# Patient Record
Sex: Female | Born: 1980 | Race: White | Hispanic: No | Marital: Married | Smoking: Former smoker
Health system: Southern US, Community
[De-identification: ages and names within clinical notes are randomized; demographics above are authoritative.]

## PROBLEM LIST (undated history)

## (undated) DIAGNOSIS — N200 Calculus of kidney: Secondary | ICD-10-CM

## (undated) HISTORY — PX: TUBAL LIGATION: SHX77

---

## 2002-11-28 ENCOUNTER — Emergency Department (HOSPITAL_COMMUNITY): Admission: EM | Admit: 2002-11-28 | Discharge: 2002-11-28 | Payer: Self-pay | Admitting: Emergency Medicine

## 2004-03-05 ENCOUNTER — Emergency Department (HOSPITAL_COMMUNITY): Admission: EM | Admit: 2004-03-05 | Discharge: 2004-03-05 | Payer: Self-pay | Admitting: Emergency Medicine

## 2004-06-23 ENCOUNTER — Emergency Department (HOSPITAL_COMMUNITY): Admission: EM | Admit: 2004-06-23 | Discharge: 2004-06-23 | Payer: Self-pay | Admitting: *Deleted

## 2004-10-23 ENCOUNTER — Emergency Department (HOSPITAL_COMMUNITY): Admission: EM | Admit: 2004-10-23 | Discharge: 2004-10-23 | Payer: Self-pay | Admitting: Emergency Medicine

## 2004-11-29 ENCOUNTER — Other Ambulatory Visit: Payer: Self-pay

## 2004-11-29 ENCOUNTER — Emergency Department: Payer: Self-pay | Admitting: Emergency Medicine

## 2005-12-12 ENCOUNTER — Emergency Department (HOSPITAL_COMMUNITY): Admission: EM | Admit: 2005-12-12 | Discharge: 2005-12-12 | Payer: Self-pay | Admitting: Emergency Medicine

## 2007-02-25 ENCOUNTER — Emergency Department (HOSPITAL_COMMUNITY): Admission: EM | Admit: 2007-02-25 | Discharge: 2007-02-25 | Payer: Self-pay | Admitting: Emergency Medicine

## 2007-09-22 ENCOUNTER — Emergency Department (HOSPITAL_COMMUNITY): Admission: EM | Admit: 2007-09-22 | Discharge: 2007-09-22 | Payer: Self-pay | Admitting: Emergency Medicine

## 2008-07-25 ENCOUNTER — Emergency Department (HOSPITAL_COMMUNITY): Admission: EM | Admit: 2008-07-25 | Discharge: 2008-07-25 | Payer: Self-pay | Admitting: Emergency Medicine

## 2009-01-17 ENCOUNTER — Emergency Department (HOSPITAL_COMMUNITY): Admission: EM | Admit: 2009-01-17 | Discharge: 2009-01-17 | Payer: Self-pay | Admitting: Emergency Medicine

## 2011-04-19 LAB — RAPID STREP SCREEN (MED CTR MEBANE ONLY): Streptococcus, Group A Screen (Direct): POSITIVE — AB

## 2011-05-02 LAB — URINE MICROSCOPIC-ADD ON

## 2011-05-02 LAB — PREGNANCY, URINE: Preg Test, Ur: NEGATIVE

## 2011-05-02 LAB — URINALYSIS, ROUTINE W REFLEX MICROSCOPIC
Bilirubin Urine: NEGATIVE
Glucose, UA: NEGATIVE mg/dL
Ketones, ur: NEGATIVE mg/dL
Leukocytes, UA: NEGATIVE
Nitrite: NEGATIVE
Specific Gravity, Urine: 1.025 (ref 1.005–1.030)
Urobilinogen, UA: 0.2 mg/dL (ref 0.0–1.0)
pH: 6 (ref 5.0–8.0)

## 2011-05-02 LAB — CBC
HCT: 42.5 % (ref 36.0–46.0)
Hemoglobin: 14.2 g/dL (ref 12.0–15.0)
MCHC: 33.5 g/dL (ref 30.0–36.0)
MCV: 90.5 fL (ref 78.0–100.0)
Platelets: 213 10*3/uL (ref 150–400)
RBC: 4.69 MIL/uL (ref 3.87–5.11)
RDW: 12.4 % (ref 11.5–15.5)
WBC: 7.3 10*3/uL (ref 4.0–10.5)

## 2011-05-02 LAB — DIFFERENTIAL
Basophils Absolute: 0 10*3/uL (ref 0.0–0.1)
Basophils Relative: 0 % (ref 0–1)
Eosinophils Absolute: 0.2 10*3/uL (ref 0.0–0.7)
Eosinophils Relative: 2 % (ref 0–5)
Lymphocytes Relative: 22 % (ref 12–46)
Lymphs Abs: 1.6 10*3/uL (ref 0.7–4.0)
Monocytes Absolute: 0.4 10*3/uL (ref 0.1–1.0)
Monocytes Relative: 6 % (ref 3–12)
Neutro Abs: 5.1 10*3/uL (ref 1.7–7.7)
Neutrophils Relative %: 69 % (ref 43–77)

## 2011-05-13 LAB — URINALYSIS, ROUTINE W REFLEX MICROSCOPIC
Bilirubin Urine: NEGATIVE
Glucose, UA: NEGATIVE
Hgb urine dipstick: NEGATIVE
Ketones, ur: NEGATIVE
Nitrite: NEGATIVE
Protein, ur: NEGATIVE
Specific Gravity, Urine: 1.025
Urobilinogen, UA: 0.2
pH: 6.5

## 2011-05-13 LAB — BASIC METABOLIC PANEL
BUN: 11
CO2: 27
Calcium: 9
Chloride: 105
Creatinine, Ser: 0.74
GFR calc Af Amer: >60
GFR calc non Af Amer: >60
Glucose, Bld: 95
Potassium: 4.2
Sodium: 138

## 2011-05-13 LAB — GC/CHLAMYDIA PROBE AMP, GENITAL
Chlamydia, DNA Probe: NEGATIVE
GC Probe Amp, Genital: NEGATIVE

## 2011-05-13 LAB — DIFFERENTIAL
Basophils Absolute: 0
Basophils Relative: 1
Eosinophils Absolute: 0.2
Eosinophils Relative: 3
Lymphocytes Relative: 27
Lymphs Abs: 1.9
Monocytes Absolute: 0.5
Monocytes Relative: 7
Neutro Abs: 4.5
Neutrophils Relative %: 62

## 2011-05-13 LAB — WET PREP, GENITAL
Trich, Wet Prep: NONE SEEN
Yeast Wet Prep HPF POC: NONE SEEN

## 2011-05-13 LAB — CBC
HCT: 38.1
Hemoglobin: 12.8
MCHC: 33.6
MCV: 88.7
Platelets: 181
RBC: 4.3
RDW: 13.2
WBC: 7.2

## 2011-05-13 LAB — PREGNANCY, URINE: Preg Test, Ur: NEGATIVE

## 2011-09-05 ENCOUNTER — Emergency Department (HOSPITAL_COMMUNITY)
Admission: EM | Admit: 2011-09-05 | Discharge: 2011-09-05 | Disposition: A | Discharge status: Home / Self Care | Payer: Self-pay | Attending: Emergency Medicine | Admitting: Emergency Medicine

## 2011-09-05 ENCOUNTER — Encounter (HOSPITAL_COMMUNITY): Payer: Self-pay

## 2011-09-05 DIAGNOSIS — R319 Hematuria, unspecified: Secondary | ICD-10-CM | POA: Insufficient documentation

## 2011-09-05 DIAGNOSIS — R3911 Hesitancy of micturition: Secondary | ICD-10-CM | POA: Insufficient documentation

## 2011-09-05 DIAGNOSIS — F17200 Nicotine dependence, unspecified, uncomplicated: Secondary | ICD-10-CM | POA: Insufficient documentation

## 2011-09-05 DIAGNOSIS — R109 Unspecified abdominal pain: Secondary | ICD-10-CM | POA: Insufficient documentation

## 2011-09-05 DIAGNOSIS — R112 Nausea with vomiting, unspecified: Secondary | ICD-10-CM | POA: Insufficient documentation

## 2011-09-05 LAB — URINALYSIS, ROUTINE W REFLEX MICROSCOPIC
Glucose, UA: NEGATIVE mg/dL
Ketones, ur: NEGATIVE mg/dL
Leukocytes, UA: NEGATIVE
Specific Gravity, Urine: >1.03 — ABNORMAL HIGH (ref 1.005–1.030)

## 2011-09-05 LAB — URINE MICROSCOPIC-ADD ON

## 2011-09-05 LAB — POCT PREGNANCY, URINE: Preg Test, Ur: NEGATIVE

## 2011-09-05 MED ORDER — ONDANSETRON 8 MG PO TBDP
8.0000 mg | ORAL_TABLET | Freq: Once | ORAL | Status: AC
  Administered 2011-09-05: 8 mg via ORAL
  Filled 2011-09-05: qty 1

## 2011-09-05 MED ORDER — IBUPROFEN 800 MG PO TABS
800.0000 mg | ORAL_TABLET | Freq: Once | ORAL | Status: AC
  Administered 2011-09-05: 800 mg via ORAL
  Filled 2011-09-05: qty 1

## 2011-09-05 MED ORDER — OXYCODONE-ACETAMINOPHEN 5-325 MG PO TABS
ORAL_TABLET | ORAL | Status: AC
  Filled 2011-09-05: qty 1

## 2011-09-05 MED ORDER — OXYCODONE-ACETAMINOPHEN 5-325 MG PO TABS: 1.0000 | ORAL_TABLET | ORAL | Status: AC | PRN

## 2011-09-05 MED ORDER — OXYCODONE-ACETAMINOPHEN 5-325 MG PO TABS
2.0000 | ORAL_TABLET | Freq: Once | ORAL | Status: AC
  Administered 2011-09-05: 2 via ORAL
  Filled 2011-09-05: qty 2

## 2011-09-05 NOTE — ED Notes (Signed)
Complain of pain in left flank area 

## 2011-09-05 NOTE — ED Notes (Signed)
C/o sudden onset of left flank pain, suprapubic pain and urinary frequency at 0500 this morning.  C/o n/v x 1 episode, denies nausea presently.

## 2011-09-05 NOTE — ED Notes (Signed)
Denies pain and nausea; left in c/o husband for transport home; instructions reviewed and f/u information provided. Verbalizes understanding.

## 2011-09-05 NOTE — ED Provider Notes (Signed)
History   This chart was scribed for Joya Gaskins, MD by Clarita Crane. The patient was seen in room APA06/APA06 and the patient's care was started at 8:13AM.   CSN: [id number]  Arrival date & time 09/05/11  4098   First MD Initiated Contact with Patient 09/05/11 0802      Chief Complaint  Patient presents with  . Flank Pain     HPI Jillian Oneill is a 31 y.o. female who presents to the Emergency Department complaining of waxing and waning moderate to severe left sided flank pain radiating to left side abdomen onset 3 hours ago and persistent since with associated urinary hesitancy, nausea and 1 episode of vomiting. Denies fever, vaginal bleeding, vaginal discharge, cough, SOB.  PMH - none  History reviewed. No pertinent past surgical history.  History reviewed. No pertinent family history.  History  Substance Use Topics  . Smoking status: Current Everyday Smoker  . Smokeless tobacco: Not on file  . Alcohol Use: No    OB History    Grav Para Term Preterm Abortions TAB SAB Ect Mult Living                  Review of Systems 10 Systems reviewed and are negative for acute change except as noted in the HPI.  Allergies  Review of patient's allergies indicates no known allergies.  Home Medications  No current outpatient prescriptions on file.  Pulse 58  Temp 97.8 F (36.6 C)  Resp 18  Ht 5\' 2"  (1.575 m)  Wt 180 lb (81.647 kg)  BMI 32.92 kg/m2  SpO2 100%  Physical Exam CONSTITUTIONAL: Well developed/well nourished HEAD AND FACE: Normocephalic/atraumatic EYES: EOMI/PERRL ENMT: Mucous membranes moist NECK: supple no meningeal signs SPINE:entire spine nontender CV: S1/S2 noted, no murmurs/rubs/gallops noted LUNGS: Lungs are clear to auscultation bilaterally, no apparent distress ABDOMEN: soft, nontender, no rebound or guarding, pain is non-reproducible JX:BJYN cva tenderness present NEURO: Pt is awake/alert, moves all extremitiesx4 EXTREMITIES: pulses  normal, full ROM SKIN: warm, color normal PSYCH: no abnormalities of mood noted  ED Course  Procedures   DIAGNOSTIC STUDIES: Oxygen Saturation is 100% on room air, normal by my interpretation.    COORDINATION OF CARE: 8:15AM- Patient informed of current plan for treatment and evaluation and agrees with plan at this time. Zofran-8mg  PO, 2xPercocet-5/325mg  PO and Ibuprofen-800mg  PO ordered.  9:17AM- Patient notes she is feeling better at this time after administration of medications.  9:55AM- Patient reports no pain at this time and advised of plan to d/c home with prescriptions for treatment. Patient agrees with plan set forth at this time.  Suspect ureteral colic, and since pain ceased, stable for d/c  Labs Reviewed  URINALYSIS, ROUTINE W REFLEX MICROSCOPIC - Abnormal; Notable for the following:    APPearance HAZY (*)    Specific Gravity, Urine >1.030 (*)    Hgb urine dipstick LARGE (*)    Bilirubin Urine SMALL (*)    Protein, ur 100 (*)    All other components within normal limits  URINE MICROSCOPIC-ADD ON - Abnormal; Notable for the following:    Squamous Epithelial / LPF MANY (*)    Bacteria, UA MANY (*)    All other components within normal limits  POCT PREGNANCY, URINE      MDM  Nursing notes reviewed and considered in documentation All labs/vitals reviewed and considered       I personally performed the services described in this documentation, which was scribed in my presence.  The recorded information has been reviewed and considered.      Joya Gaskins, MD 09/05/11 1459

## 2011-12-17 ENCOUNTER — Emergency Department (HOSPITAL_COMMUNITY)
Admission: EM | Admit: 2011-12-17 | Discharge: 2011-12-18 | Disposition: A | Discharge status: Home / Self Care | Payer: Self-pay | Attending: Emergency Medicine | Admitting: Emergency Medicine

## 2011-12-17 ENCOUNTER — Emergency Department (HOSPITAL_COMMUNITY): Payer: Self-pay

## 2011-12-17 ENCOUNTER — Encounter (HOSPITAL_COMMUNITY): Payer: Self-pay | Admitting: *Deleted

## 2011-12-17 DIAGNOSIS — R0989 Other specified symptoms and signs involving the circulatory and respiratory systems: Secondary | ICD-10-CM | POA: Insufficient documentation

## 2011-12-17 DIAGNOSIS — R0600 Dyspnea, unspecified: Secondary | ICD-10-CM

## 2011-12-17 DIAGNOSIS — R109 Unspecified abdominal pain: Secondary | ICD-10-CM | POA: Insufficient documentation

## 2011-12-17 DIAGNOSIS — R0609 Other forms of dyspnea: Secondary | ICD-10-CM | POA: Insufficient documentation

## 2011-12-17 HISTORY — DX: Calculus of kidney: N20.0

## 2011-12-17 NOTE — ED Notes (Signed)
Pt reports be has been experiencing occasional  SOB for about 1 week.  Reports at work she has to bend and lift a lot, and gets SOB when she does.  Also reporting some pain in right lower abdomen/groin area that started today.

## 2011-12-17 NOTE — ED Provider Notes (Signed)
History     CSN: [id number]  Arrival date & time 12/17/11  2020   First MD Initiated Contact with Patient 12/17/11 2304      Chief Complaint  Patient presents with  . Shortness of Breath  . Abdominal Pain    (Consider location/radiation/quality/duration/timing/severity/associated sxs/prior treatment) Patient is a 31 y.o. female presenting with shortness of breath and abdominal pain. The history is provided by the patient.  Shortness of Breath  The current episode started 5 to 7 days ago. The problem occurs occasionally. The problem has been gradually worsening. The problem is moderate. The symptoms are relieved by rest and cold air. Exacerbated by: lifting, pushing and pulling. Associated symptoms include shortness of breath. Pertinent negatives include no chest pain, no chest pressure, no cough and no wheezing. There was no intake of a foreign body. She has not inhaled smoke recently. She has had no prior hospitalizations. She has had no prior ICU admissions. She has had no prior intubations. Her past medical history does not include asthma or bronchiolitis. She has been behaving normally. Urine output has been normal. The last void occurred less than 6 hours ago. There were no sick contacts. She has received no recent medical care.  Abdominal Pain The primary symptoms of the illness include abdominal pain and shortness of breath. The primary symptoms of the illness do not include dysuria.  The patient's medical history does not include asthma.  Symptoms associated with the illness do not include hematuria, frequency or back pain.    Past Medical History  Diagnosis Date  . Kidney stones     Past Surgical History  Procedure Date  . Cesarean section     History reviewed. No pertinent family history.  History  Substance Use Topics  . Smoking status: Current Everyday Smoker -- 0.2 packs/day    Types: Cigarettes  . Smokeless tobacco: Not on file  . Alcohol Use: No    OB  History    Grav Para Term Preterm Abortions TAB SAB Ect Mult Living                  Review of Systems  Constitutional: Negative for activity change.       All ROS Neg except as noted in HPI  HENT: Negative for nosebleeds and neck pain.   Eyes: Negative for photophobia and discharge.  Respiratory: Positive for shortness of breath. Negative for cough and wheezing.   Cardiovascular: Negative for chest pain and palpitations.  Gastrointestinal: Positive for abdominal pain. Negative for blood in stool.  Genitourinary: Negative for dysuria, frequency and hematuria.  Musculoskeletal: Negative for back pain and arthralgias.  Skin: Negative.   Neurological: Negative for dizziness, seizures and speech difficulty.  Psychiatric/Behavioral: Negative for hallucinations and confusion.    Allergies  Review of patient's allergies indicates no known allergies.  Home Medications   Current Outpatient Rx  Name Route Sig Dispense Refill  . IBUPROFEN 200 MG PO TABS Oral Take 800 mg by mouth daily as needed. Pain      BP 113/76  Pulse 97  Temp(Src) 97.9 F (36.6 C) (Oral)  Resp 20  Ht 5\' 2"  (1.575 m)  Wt 185 lb (83.915 kg)  BMI 33.84 kg/m2  SpO2 100%  Physical Exam  Nursing note and vitals reviewed. Constitutional: She is oriented to person, place, and time. She appears well-developed and well-nourished.  Non-toxic appearance.  HENT:  Head: Normocephalic.  Right Ear: Tympanic membrane and external ear normal.  Left Ear: Tympanic membrane  and external ear normal.  Eyes: EOM and lids are normal. Pupils are equal, round, and reactive to light.  Neck: Normal range of motion. Neck supple. Carotid bruit is not present.  Cardiovascular: Normal rate, regular rhythm, normal heart sounds, intact distal pulses and normal pulses.   Pulmonary/Chest: Breath sounds normal. No respiratory distress. She has no wheezes. She has no rales.       Few rhonchi that mostly clear with cough  Abdominal: Soft.  Bowel sounds are normal. There is no tenderness. There is no guarding.  Musculoskeletal: Normal range of motion.       Lumbar area. Pain with change of position and leg raises.  Lymphadenopathy:       Head (right side): No submandibular adenopathy present.       Head (left side): No submandibular adenopathy present.    She has no cervical adenopathy.  Neurological: She is alert and oriented to person, place, and time. She has normal strength. No cranial nerve deficit or sensory deficit. She exhibits normal muscle tone. Coordination normal.  Skin: Skin is warm and dry.  Psychiatric: She has a normal mood and affect. Her speech is normal.    ED Course  Procedures (including critical care time) EKG 2013 - rate: 56. Rhythm: Sinus bradycardia with sinus arrhythmia. P.-R.: Normal. QRS: Normal. ST: Normal. No life-threatening arrhythmia  Appreciated. No STEMI. Labs Reviewed - No data to display No results found. Pulse ox 100% on room air. WNL by my interpretation.  No diagnosis found.    MDM  I have reviewed nursing notes, vital signs, and all appropriate lab and imaging results for this patient. Labs, and chest xray are negative. EKG non-acute. NO reported chest pain or chest tightness or LOC. Pt to follow up with the [facility]. Albuterol inhaler given for pt to try if difficulty breathing should return. Pt invited to return to the Emergency Dept if any changes or concerns. Pt ambulatory in ED without problem.       Kathie Dike, [place] 12/18/11 [phone]

## 2011-12-17 NOTE — ED Notes (Signed)
States her job that she lifts and bends a lot at work, now has SOB

## 2011-12-18 LAB — CBC
HCT: 43.2 % (ref 36.0–46.0)
Platelets: 209 10*3/uL (ref 150–400)
RDW: 12.5 % (ref 11.5–15.5)
WBC: 8.6 10*3/uL (ref 4.0–10.5)

## 2011-12-18 LAB — BASIC METABOLIC PANEL
Calcium: 10.4 mg/dL (ref 8.4–10.5)
Chloride: 103 mEq/L (ref 96–112)
Creatinine, Ser: 0.93 mg/dL (ref 0.50–1.10)
GFR calc Af Amer: >90 mL/min (ref >90)
GFR calc non Af Amer: 81 mL/min — ABNORMAL LOW (ref >90)

## 2011-12-18 LAB — DIFFERENTIAL
Basophils Absolute: 0.1 10*3/uL (ref 0.0–0.1)
Basophils Relative: 1 % (ref 0–1)
Lymphocytes Relative: 39 % (ref 12–46)
Neutro Abs: 4.1 10*3/uL (ref 1.7–7.7)
Neutrophils Relative %: 47 % (ref 43–77)

## 2011-12-18 LAB — TROPONIN I: Troponin I: <0.3 ng/mL (ref <0.30)

## 2011-12-18 MED ORDER — ALBUTEROL SULFATE HFA 108 (90 BASE) MCG/ACT IN AERS
2.0000 | INHALATION_SPRAY | RESPIRATORY_TRACT | Status: DC | PRN
  Filled 2011-12-18: qty 6.7

## 2011-12-18 NOTE — Discharge Instructions (Signed)
Your lab test and xrays are negative for acute problem tonight. Please use albuterol inhaler every 4 hours for difficulty with breathing. Please see your primary MD or MD at the [facility] for additional evaluation. Return to the Emergency Dept if any acute changes.Dyspnea Shortness of breath (dyspnea) is the feeling of uneasy breathing. Dyspnea should be evaluated promptly. DIAGNOSIS  Many tests may be done to find why you are having shortness of breath. Tests may include:  A chest X-ray.   A lung function test.   Blood tests.   Recordings of the electrical activity of the heart (electrocardiogram).   Exercise testing.   Sound wave images of the heart (a cardiac echocardiogram).   A scan.  A cause for your shortness of breath may not be identified initially. In this case, it is important to have a follow-up exam with your caregiver. HOME CARE INSTRUCTIONS   Do not smoke. Smoking is a common cause of shortness of breath. Ask for help to stop smoking.   Avoid being around chemicals that may bother your breathing, such as paint fumes or dust.   Rest as needed. Slowly begin your usual activities.   If medications were prescribed, take them as directed for the full length of time directed. This includes oxygen and any inhaled medications, if prescribed.   It is very important that you follow up with your caregiver or other physician as directed. Waiting to do so or failure to follow up could result in worsening of your condition, possible disability, or death.   Be sure you understand what to do or who to call if your shortness of breath worsens.  SEEK MEDICAL CARE IF:   Your condition does not improve in the time expected.   You have a hard time doing your normal activities even with rest.   You have any side effects from or problems with medications prescribed.  SEEK IMMEDIATE MEDICAL CARE IF:   You feel your shortness of breath is getting worse.   You feel  lightheaded, faint or develop a cough not controlled with medications.   You start coughing up blood.   You get pain with breathing.   You get chest pain or pain in your arms, shoulders or belly (abdomen).   You have a fever.   You are unable to walk up stairs or exercise the way you normally can.  MAKE SURE YOU:   Understand these instructions.   Will watch your condition.   Will get help right away if you are not doing well or get worse.  Document Released: 08/22/2004 Document Revised: 03/27/2011 Document Reviewed: 11/30/2009 [facility] Patient Information 2012 [place], [place].

## 2011-12-18 NOTE — ED Provider Notes (Signed)
Medical screening examination/treatment/procedure(s) were performed by non-physician practitioner and as supervising physician I was immediately available for consultation/collaboration.  Alisabeth Selkirk S. Nandana Krolikowski, MD 12/18/11 0447 

## 2014-10-31 ENCOUNTER — Emergency Department (HOSPITAL_COMMUNITY)
Admission: EM | Admit: 2014-10-31 | Discharge: 2014-10-31 | Disposition: A | Discharge status: Home / Self Care | Payer: Worker's Compensation | Attending: Emergency Medicine | Admitting: Emergency Medicine

## 2014-10-31 ENCOUNTER — Encounter (HOSPITAL_COMMUNITY): Payer: Self-pay | Admitting: Emergency Medicine

## 2014-10-31 ENCOUNTER — Emergency Department (HOSPITAL_COMMUNITY): Payer: Worker's Compensation

## 2014-10-31 DIAGNOSIS — W010XXA Fall on same level from slipping, tripping and stumbling without subsequent striking against object, initial encounter: Secondary | ICD-10-CM | POA: Insufficient documentation

## 2014-10-31 DIAGNOSIS — S40012A Contusion of left shoulder, initial encounter: Secondary | ICD-10-CM | POA: Insufficient documentation

## 2014-10-31 DIAGNOSIS — Z720 Tobacco use: Secondary | ICD-10-CM | POA: Insufficient documentation

## 2014-10-31 DIAGNOSIS — Z87442 Personal history of urinary calculi: Secondary | ICD-10-CM | POA: Insufficient documentation

## 2014-10-31 DIAGNOSIS — S60212A Contusion of left wrist, initial encounter: Secondary | ICD-10-CM | POA: Insufficient documentation

## 2014-10-31 DIAGNOSIS — T1490XA Injury, unspecified, initial encounter: Secondary | ICD-10-CM

## 2014-10-31 DIAGNOSIS — Y9289 Other specified places as the place of occurrence of the external cause: Secondary | ICD-10-CM | POA: Insufficient documentation

## 2014-10-31 DIAGNOSIS — Y9389 Activity, other specified: Secondary | ICD-10-CM | POA: Insufficient documentation

## 2014-10-31 DIAGNOSIS — Y990 Civilian activity done for income or pay: Secondary | ICD-10-CM | POA: Insufficient documentation

## 2014-10-31 NOTE — ED Notes (Signed)
PT c/o slipping on a wet surface at work and falling onto her left side and c/o left arm and left hip pain. PT states this is workman's comp and registration made aware.

## 2014-10-31 NOTE — ED Provider Notes (Signed)
CSN: [id number]     Arrival date & time 10/31/14  0734 History   First MD Initiated Contact with Patient 10/31/14 [phone]     Chief Complaint  Patient presents with  . Fall     (Consider location/radiation/quality/duration/timing/severity/associated sxs/prior Treatment) HPI Comments: Patient here after slipping and falling at work and injuring her left wrist and left shoulder. Denies any head injury. No neck or back pain. Denies any hip pain. Pain at the left wrist and shoulder characterized as dull and worse with movement and better with rest. No treatment use prior to arrival.  Patient is a 34 y.o. female presenting with fall. The history is provided by the patient.  Fall    Past Medical History  Diagnosis Date  . Kidney stones    Past Surgical History  Procedure Laterality Date  . Cesarean section    . Tubal ligation     Family History  Problem Relation Age of Onset  . Hypertension Mother   . Hypertension Father    History  Substance Use Topics  . Smoking status: Current Every Day Smoker -- 0.25 packs/day    Types: Cigarettes  . Smokeless tobacco: Not on file  . Alcohol Use: No   OB History    Gravida Para Term Preterm AB TAB SAB Ectopic Multiple Living            2     Review of Systems  All other systems reviewed and are negative.     Allergies  Review of patient's allergies indicates no known allergies.  Home Medications   Prior to Admission medications   Medication Sig Start Date End Date Taking? Authorizing Provider  ibuprofen (ADVIL,MOTRIN) 200 MG tablet Take 800 mg by mouth daily as needed. Pain    Historical Provider, MD   BP 110/77 mmHg  Pulse 74  Temp(Src) 97.9 F (36.6 C) (Oral)  Resp 18  Ht  (1.575 m)  Wt 180 lb (81.647 kg)  BMI 32.91 kg/m2  SpO2 100% Physical Exam  Constitutional: She is oriented to person, place, and time. She appears well-developed and well-nourished.  Non-toxic appearance. No distress.  HENT:  Head:  Normocephalic and atraumatic.  Eyes: Conjunctivae, EOM and lids are normal. Pupils are equal, round, and reactive to light.  Neck: Normal range of motion. Neck supple. No tracheal deviation present. No thyroid mass present.  Cardiovascular: Normal rate, regular rhythm and normal heart sounds.  Exam reveals no gallop.   No murmur heard. Pulmonary/Chest: Effort normal and breath sounds normal. No stridor. No respiratory distress. She has no decreased breath sounds. She has no wheezes. She has no rhonchi. She has no rales.  Abdominal: Soft. Normal appearance and bowel sounds are normal. She exhibits no distension. There is no tenderness. There is no rebound and no CVA tenderness.  Musculoskeletal: Normal range of motion. She exhibits no edema or tenderness.       Arms: Neurological: She is alert and oriented to person, place, and time. She has normal strength. No cranial nerve deficit or sensory deficit. GCS eye subscore is 4. GCS verbal subscore is 5. GCS motor subscore is 6.  Skin: Skin is warm and dry. No abrasion and no rash noted.  Psychiatric: She has a normal mood and affect. Her speech is normal and behavior is normal.  Nursing note and vitals reviewed.   ED Course  Procedures (including critical care time) Labs Review Labs Reviewed - No data to display  Imaging Review No results found.  EKG Interpretation None      MDM   Final diagnoses:  Trauma    All xrays neg, pt stable for d/c    Lorre NickAnthony Duston Smolenski, MD 11/04/14 [phone]

## 2014-10-31 NOTE — Discharge Instructions (Signed)
Contusion °A contusion is a deep bruise. Contusions are the result of an injury that caused bleeding under the skin. The contusion may turn blue, purple, or yellow. Minor injuries will give you a painless contusion, but more severe contusions may stay painful and swollen for a few weeks.  °CAUSES  °A contusion is usually caused by a blow, trauma, or direct force to an area of the body. °SYMPTOMS  °· Swelling and redness of the injured area. °· Bruising of the injured area. °· Tenderness and soreness of the injured area. °· Pain. °DIAGNOSIS  °The diagnosis can be made by taking a history and physical exam. An X-ray, CT scan, or MRI may be needed to determine if there were any associated injuries, such as fractures. °TREATMENT  °Specific treatment will depend on what area of the body was injured. In general, the best treatment for a contusion is resting, icing, elevating, and applying cold compresses to the injured area. Over-the-counter medicines may also be recommended for pain control. Ask your caregiver what the best treatment is for your contusion. °HOME CARE INSTRUCTIONS  °· Put ice on the injured area. °¨ Put ice in a plastic bag. °¨ Place a towel between your skin and the bag. °¨ Leave the ice on for 15-20 minutes, 3-4 times a day, or as directed by your health care provider. °· Only take over-the-counter or prescription medicines for pain, discomfort, or fever as directed by your caregiver. Your caregiver may recommend avoiding anti-inflammatory medicines (aspirin, ibuprofen, and naproxen) for 48 hours because these medicines may increase bruising. °· Rest the injured area. °· If possible, elevate the injured area to reduce swelling. °SEEK IMMEDIATE MEDICAL CARE IF:  °· You have increased bruising or swelling. °· You have pain that is getting worse. °· Your swelling or pain is not relieved with medicines. °MAKE SURE YOU:  °· Understand these instructions. °· Will watch your condition. °· Will get help right  away if you are not doing well or get worse. °Document Released: 04/24/2005 Document Revised: 07/20/2013 Document Reviewed: 05/20/2011 °[employer]® Patient Information ©2015 [employer]. This information is not intended to replace advice given to you by your health care provider. Make sure you discuss any questions you have with your health care provider. ° °

## 2015-07-01 ENCOUNTER — Emergency Department (HOSPITAL_COMMUNITY)
Admission: EM | Admit: 2015-07-01 | Discharge: 2015-07-01 | Disposition: A | Discharge status: Home / Self Care | Payer: Self-pay | Attending: Emergency Medicine | Admitting: Emergency Medicine

## 2015-07-01 ENCOUNTER — Encounter (HOSPITAL_COMMUNITY): Payer: Self-pay | Admitting: Emergency Medicine

## 2015-07-01 DIAGNOSIS — Z87442 Personal history of urinary calculi: Secondary | ICD-10-CM | POA: Insufficient documentation

## 2015-07-01 DIAGNOSIS — Z3202 Encounter for pregnancy test, result negative: Secondary | ICD-10-CM | POA: Insufficient documentation

## 2015-07-01 DIAGNOSIS — R300 Dysuria: Secondary | ICD-10-CM | POA: Insufficient documentation

## 2015-07-01 DIAGNOSIS — G8929 Other chronic pain: Secondary | ICD-10-CM | POA: Insufficient documentation

## 2015-07-01 DIAGNOSIS — R319 Hematuria, unspecified: Secondary | ICD-10-CM | POA: Insufficient documentation

## 2015-07-01 DIAGNOSIS — R1030 Lower abdominal pain, unspecified: Secondary | ICD-10-CM | POA: Insufficient documentation

## 2015-07-01 DIAGNOSIS — R0981 Nasal congestion: Secondary | ICD-10-CM | POA: Insufficient documentation

## 2015-07-01 DIAGNOSIS — F17210 Nicotine dependence, cigarettes, uncomplicated: Secondary | ICD-10-CM | POA: Insufficient documentation

## 2015-07-01 LAB — URINALYSIS, ROUTINE W REFLEX MICROSCOPIC
BILIRUBIN URINE: NEGATIVE
GLUCOSE, UA: NEGATIVE mg/dL
KETONES UR: NEGATIVE mg/dL
Leukocytes, UA: NEGATIVE
Nitrite: NEGATIVE
Specific Gravity, Urine: 1.02 (ref 1.005–1.030)
pH: 6 (ref 5.0–8.0)

## 2015-07-01 LAB — URINE MICROSCOPIC-ADD ON

## 2015-07-01 LAB — PREGNANCY, URINE: PREG TEST UR: NEGATIVE

## 2015-07-01 MED ORDER — ACETAMINOPHEN 500 MG PO TABS
1000.0000 mg | ORAL_TABLET | Freq: Once | ORAL | Status: AC
  Administered 2015-07-01: 1000 mg via ORAL
  Filled 2015-07-01: qty 2

## 2015-07-01 MED ORDER — CEPHALEXIN 500 MG PO CAPS: 500.0000 mg | ORAL_CAPSULE | Freq: Two times a day (BID) | ORAL | Status: DC

## 2015-07-01 NOTE — ED Notes (Signed)
Pt states that she woke up this morning with lower abdominal pain, dysuria, and blood when wiping.  Also wants her back checked while she is here due to chronic issues and is also c/o sinus pressure, cough, and congestion.

## 2015-07-01 NOTE — ED Provider Notes (Signed)
CSN: [id number]     Arrival date & time 07/01/15  1121 History   First MD Initiated Contact with Patient 07/01/15 1147     Chief Complaint  Patient presents with  . Abdominal Pain     (Consider location/radiation/quality/duration/timing/severity/associated sxs/prior Treatment) HPI Comments: 34 year old female presents with multiple complaints. New to this visit is dysuria, mild hematuria with wiping and suprapubic discomfort since this morning. Patient has no new sexual contacts no vaginal complaints. Patient had C-section and tubal ligation years ago. Patient is a current smoker. No vomiting or fevers. No flank pain, chronic back pain. Mild congestion and cough.  Patient is a 34 y.o. female presenting with abdominal pain. The history is provided by the patient.  Abdominal Pain Associated symptoms: dysuria   Associated symptoms: no chills, no fever, no nausea and no vomiting     Past Medical History  Diagnosis Date  . Kidney stones    Past Surgical History  Procedure Laterality Date  . Cesarean section    . Tubal ligation     Family History  Problem Relation Age of Onset  . Hypertension Mother   . Hypertension Father    Social History  Substance Use Topics  . Smoking status: Current Every Day Smoker -- 0.25 packs/day    Types: Cigarettes  . Smokeless tobacco: None  . Alcohol Use: No   OB History    Gravida Para Term Preterm AB TAB SAB Ectopic Multiple Living            2     Review of Systems  Constitutional: Negative for fever and chills.  HENT: Positive for congestion.   Gastrointestinal: Positive for abdominal pain. Negative for nausea and vomiting.  Genitourinary: Positive for dysuria.      Allergies  Review of patient's allergies indicates no known allergies.  Home Medications   Prior to Admission medications   Medication Sig Start Date End Date Taking? Authorizing Provider  Pseudoephedrine-APAP-DM (DAYQUIL PO) Take 30 mLs by mouth every 6 (six) hours  as needed (cough).   Yes Historical Provider, MD  cephALEXin (KEFLEX) 500 MG capsule Take 1 capsule (500 mg total) by mouth 2 (two) times daily. 07/01/15   Blane Ohara, MD   BP 109/69 mmHg  Pulse 61  Temp(Src) 98.8 F (37.1 C) (Oral)  Resp 18  Ht  (1.575 m)  Wt 180 lb (81.647 kg)  BMI 32.91 kg/m2  SpO2 100% Physical Exam  Constitutional: She is oriented to person, place, and time. She appears well-developed and well-nourished.  HENT:  Head: Normocephalic and atraumatic.  Eyes: Right eye exhibits no discharge. Left eye exhibits no discharge.  Neck: Neck supple. No tracheal deviation present.  Cardiovascular: Normal rate.   Pulmonary/Chest: Effort normal.  Abdominal: Soft. She exhibits no distension. There is tenderness (mild suprapubic). There is no guarding.  Genitourinary:  Mild white discharge, no cervical motion tenderness, no bleeding  Musculoskeletal: She exhibits no edema.  Neurological: She is alert and oriented to person, place, and time.  Skin: Skin is warm. No rash noted.  Psychiatric: She has a normal mood and affect.  Nursing note and vitals reviewed.   ED Course  Procedures (including critical care time) Labs Review Labs Reviewed  URINALYSIS, ROUTINE W REFLEX MICROSCOPIC (NOT AT [facility]) - Abnormal; Notable for the following:    APPearance HAZY (*)    Hgb urine dipstick MODERATE (*)    Protein, ur TRACE (*)    All other components within normal limits  URINE  MICROSCOPIC-ADD ON - Abnormal; Notable for the following:    Squamous Epithelial / LPF 6-30 (*)    Bacteria, UA MANY (*)    All other components within normal limits  URINE CULTURE  PREGNANCY, URINE  GC/CHLAMYDIA PROBE AMP ([place]) NOT AT [facility]    Imaging Review No results found. I have personally reviewed and evaluated these images and lab results as part of my medical decision-making.   EKG Interpretation None      MDM   Final diagnoses:  Suprapubic pain, acute, unspecified  laterality  Dysuria   Well-appearing female presents with urinary symptoms. Plan for urinalysis and urine pregnancy test. No right lower quadrant pain at this time. No fever. Low suspicion for STDs as patient's and married for over 10 years with single partner.  Patient well-appearing on recheck, pelvic exam unremarkable except for mild discharge. Discussed close outpatient follow for culture results. With urinary symptoms plan for antibody treatment and patient to follow-up vaginal and urine culture.  Results and differential diagnosis were discussed with the patient/parent/guardian. Xrays were independently reviewed by myself.  Close follow up outpatient was discussed, comfortable with the plan.   Medications  acetaminophen (TYLENOL) tablet 1,000 mg (1,000 mg Oral Given 07/01/15 1207)    Filed Vitals:   07/01/15 1125 07/01/15 1356  BP: 131/89 109/69  Pulse: 78 61  Temp: 98.8 F (37.1 C) 98.8 F (37.1 C)  TempSrc: Oral   Resp: 18 18  Height: 5\' 2"  (1.575 m)   Weight: 180 lb (81.647 kg)   SpO2: 100% 100%    Final diagnoses:  Suprapubic pain, acute, unspecified laterality  Dysuria       Blane OharaJoshua Fanchon Papania, MD 07/01/15 1408

## 2015-07-01 NOTE — Discharge Instructions (Signed)
If you were given medicines take as directed.  If you are on coumadin or contraceptives realize their levels and effectiveness is altered by many different medicines.  If you have any reaction (rash, tongues swelling, other) to the medicines stop taking and see a physician.   Follow up culture results.  If your blood pressure was elevated in the ER make sure you follow up for management with a primary doctor or return for chest pain, shortness of breath or stroke symptoms.  Please follow up as directed and return to the ER or see a physician for new or worsening symptoms.  Thank you. Filed Vitals:   07/01/15 1125  BP: 131/89  Pulse: 78  Temp: 98.8 F (37.1 C)  TempSrc: Oral  Resp: 18  Height: 5\' 2"  (1.575 m)  Weight: 180 lb (81.647 kg)  SpO2: 100%

## 2015-07-03 LAB — GC/CHLAMYDIA PROBE AMP (~~LOC~~) NOT AT ARMC
CHLAMYDIA, DNA PROBE: NEGATIVE
NEISSERIA GONORRHEA: NEGATIVE

## 2015-07-04 LAB — URINE CULTURE

## 2015-07-05 NOTE — Progress Notes (Signed)
ED Antimicrobial Stewardship Positive Culture Follow Up   Jillian Oneill is an 34 y.o. female who [facility] with a chief complaint of  Chief Complaint  Patient presents with  . Abdominal Pain    Recent Results (from the past 720 hour(s))  Urine culture     Status: None   Collection Time: 07/01/15 12:05 PM  Result Value Ref Range Status   Specimen Description URINE, CLEAN CATCH  Final   Special Requests NONE  Final   Culture   Final    70,000 COLONIES/ml ENTEROBACTER CLOACAE Performed at [facility]    Report Status 07/04/2015 FINAL  Final   Organism ID, Bacteria ENTEROBACTER CLOACAE  Final      Susceptibility   Enterobacter cloacae - MIC*    CEFAZOLIN >=64 RESISTANT Resistant     CEFTRIAXONE <=1 SENSITIVE Sensitive     CIPROFLOXACIN <=0.25 SENSITIVE Sensitive     GENTAMICIN <=1 SENSITIVE Sensitive     IMIPENEM <=0.25 SENSITIVE Sensitive     NITROFURANTOIN 32 SENSITIVE Sensitive     TRIMETH/SULFA <=20 SENSITIVE Sensitive     PIP/TAZO <=4 SENSITIVE Sensitive     * 70,000 COLONIES/ml ENTEROBACTER CLOACAE    [x]  Treated with cephalexin, organism resistant to prescribed antimicrobial []  Patient discharged originally without antimicrobial agent and treatment is now indicated  New antibiotic prescription: Bactrim DS 1 tab PO BID X 3days  ED Provider: Everlene FarrierWilliam Dansie, PA-C  Bertram MillardMichael A Jiraiya Mcewan 07/05/2015, 8:50 AM Infectious Diseases Pharmacist Phone# [phone]

## 2015-07-06 ENCOUNTER — Telehealth (HOSPITAL_BASED_OUTPATIENT_CLINIC_OR_DEPARTMENT_OTHER): Payer: Self-pay | Admitting: Emergency Medicine

## 2015-07-06 NOTE — Telephone Encounter (Signed)
Post ED Visit - Positive Culture Follow-up: Successful Patient Follow-Up  Culture assessed and recommendations reviewed by: []  Enzo BiNathan Batchelder, Pharm.D. []  Celedonio MiyamotoJeremy Frens, Pharm.D., BCPS [x]  Garvin FilaMike Maccia, Pharm.D. []  Georgina PillionElizabeth Martin, Pharm.D., BCPS []  ShippingportMinh Pham, [address] BoulevardPharm.D., BCPS, AAHIVP []  Estella HuskMichelle Turner, Pharm.D., BCPS, AAHIVP []  Tennis Mustassie Stewart, Pharm.D. []  Sherle Poeob Vincent, [place].  Positive urine culture Enterobacter  []  Patient discharged without antimicrobial prescription and treatment is now indicated [x]  Organism is resistant to prescribed ED discharge antimicrobial []  Patient with positive blood cultures  Changes discussed with ED provider: Will Dansie PA d/c keflex, start bactrim DS one bid x 3 days  attemtping to contact pt   Berle MullMiller, Taedyn Glasscock 07/06/2015, 4:09 PM

## 2015-07-07 ENCOUNTER — Telehealth (HOSPITAL_COMMUNITY): Payer: Self-pay

## 2015-07-08 ENCOUNTER — Telehealth (HOSPITAL_BASED_OUTPATIENT_CLINIC_OR_DEPARTMENT_OTHER): Payer: Self-pay | Admitting: Emergency Medicine

## 2015-07-19 ENCOUNTER — Emergency Department (HOSPITAL_COMMUNITY)
Admission: EM | Admit: 2015-07-19 | Discharge: 2015-07-19 | Disposition: A | Discharge status: Home / Self Care | Payer: Self-pay | Attending: Emergency Medicine | Admitting: Emergency Medicine

## 2015-07-19 ENCOUNTER — Encounter (HOSPITAL_COMMUNITY): Payer: Self-pay | Admitting: Emergency Medicine

## 2015-07-19 DIAGNOSIS — K029 Dental caries, unspecified: Secondary | ICD-10-CM | POA: Insufficient documentation

## 2015-07-19 DIAGNOSIS — K047 Periapical abscess without sinus: Secondary | ICD-10-CM | POA: Insufficient documentation

## 2015-07-19 DIAGNOSIS — F17210 Nicotine dependence, cigarettes, uncomplicated: Secondary | ICD-10-CM | POA: Insufficient documentation

## 2015-07-19 DIAGNOSIS — Z87442 Personal history of urinary calculi: Secondary | ICD-10-CM | POA: Insufficient documentation

## 2015-07-19 LAB — CBG MONITORING, ED: Glucose-Capillary: 88 mg/dL (ref 65–99)

## 2015-07-19 MED ORDER — OXYCODONE-ACETAMINOPHEN 5-325 MG PO TABS: 1.0000 | ORAL_TABLET | Freq: Once | ORAL | Status: DC

## 2015-07-19 MED ORDER — HYDROCODONE-ACETAMINOPHEN 5-325 MG PO TABS: 2.0000 | ORAL_TABLET | ORAL | Status: DC | PRN

## 2015-07-19 MED ORDER — AMOXICILLIN 500 MG PO CAPS: 500.0000 mg | ORAL_CAPSULE | Freq: Three times a day (TID) | ORAL | Status: DC

## 2015-07-19 MED ORDER — NAPROXEN 500 MG PO TABS: 500.0000 mg | ORAL_TABLET | Freq: Two times a day (BID) | ORAL | Status: DC

## 2015-07-19 MED ORDER — AMOXICILLIN 250 MG PO CAPS
500.0000 mg | ORAL_CAPSULE | Freq: Once | ORAL | Status: AC
  Administered 2015-07-19: 500 mg via ORAL
  Filled 2015-07-19: qty 2

## 2015-07-19 NOTE — ED Notes (Addendum)
Pt states she had sharp pain in right jaw that made her feel faint and shaky. Has had the feeling for about a month and wants it checked out. States she took a Goody powder around 7pm and it took the "edge off".

## 2015-07-19 NOTE — ED Provider Notes (Signed)
CSN: [id number]     Arrival date & time 07/19/15  2120 History   First MD Initiated Contact with Patient 07/19/15 2141     Chief Complaint  Patient presents with  . Jaw Pain     (Consider location/radiation/quality/duration/timing/severity/associated sxs/prior Treatment) The history is provided by the patient.   Jillian Oneill is a 34 y.o. female who presents to the ED with right side facial pain that started tonight. She took a goody powder about 7 pm and it did help. States she is not sure if she has a bad tooth or not. She reports that the pain was so bad tonight that it made her feel shaky. She had a similar episode 2 days ago while she was at work.   Past Medical History  Diagnosis Date  . Kidney stones    Past Surgical History  Procedure Laterality Date  . Cesarean section    . Tubal ligation     Family History  Problem Relation Age of Onset  . Hypertension Mother   . Hypertension Father    Social History  Substance Use Topics  . Smoking status: Current Every Day Smoker -- 0.25 packs/day    Types: Cigarettes  . Smokeless tobacco: None  . Alcohol Use: No   OB History    Gravida Para Term Preterm AB TAB SAB Ectopic Multiple Living            2     Review of Systems Negative except as stated in HPI   Allergies  Review of patient's allergies indicates no known allergies.  Home Medications   Prior to Admission medications   Medication Sig Start Date End Date Taking? Authorizing Provider  Aspirin-Acetaminophen-Caffeine (GOODY HEADACHE PO) Take 1 packet by mouth daily as needed (for pain).   Yes Historical Provider, MD  amoxicillin (AMOXIL) 500 MG capsule Take 1 capsule (500 mg total) by mouth 3 (three) times daily. 07/19/15   Teejay Meader Orlene OchM Belita Warsame, NP  HYDROcodone-acetaminophen (NORCO/VICODIN) 5-325 MG tablet Take 2 tablets by mouth every 4 (four) hours as needed. 07/19/15   Cherokee Clowers Orlene OchM Paquita Printy, NP  naproxen (NAPROSYN) 500 MG tablet Take 1 tablet (500 mg total) by mouth 2  (two) times daily. 07/19/15   Caycee Wanat Orlene OchM Shandria Clinch, NP   BP 128/81 mmHg  Pulse 71  Temp(Src) 97.7 F (36.5 C) (Oral)  Resp 16  Ht 5\' 2"  (1.575 m)  Wt 81.647 kg  BMI 32.91 kg/m2  SpO2 99% Physical Exam  Constitutional: She is oriented to person, place, and time. She appears well-developed and well-nourished.  HENT:  Head: Normocephalic and atraumatic.  Right Ear: Tympanic membrane normal.  Left Ear: Tympanic membrane normal.  Nose: Nose normal.  Mouth/Throat: Uvula is midline and oropharynx is clear and moist. Dental abscesses and dental caries present.  Multiple dental caries right upper dental area with swelling and erythema of the gum surrounding the teeth.   Eyes: Conjunctivae and EOM are normal.  Neck: Normal range of motion. Neck supple.  Cardiovascular: Normal rate and regular rhythm.   Pulmonary/Chest: Effort normal and breath sounds normal.  Musculoskeletal: Normal range of motion.  Neurological: She is alert and oriented to person, place, and time. No cranial nerve deficit.  Skin: Skin is warm and dry.  Psychiatric: She has a normal mood and affect. Her behavior is normal.  Nursing note and vitals reviewed.   ED Course  Procedures  Results for orders placed or performed during the hospital encounter of 07/19/15 (from the past 24  hour(s))  POC CBG, ED     Status: None   Collection Time: 07/19/15 10:38 PM  Result Value Ref Range   Glucose-Capillary 88 65 - 99 mg/dL     MDM  34 y.o. female with multiple dental caries and dental pain. Stable for d/c without fever, trismus and does not appear toxic. Will treat for infection and she will follow up with a dentist as soon as possible. Discussed with the patient plan of care and all questioned fully answered. She will return if any problems arise.   Final diagnoses:  Dental abscess       Janne Napoleon, NP 07/20/15 0015  Mancel Bale, MD 07/20/15 [phone]

## 2015-07-19 NOTE — Discharge Instructions (Signed)

## 2015-08-01 ENCOUNTER — Telehealth (HOSPITAL_COMMUNITY): Payer: Self-pay

## 2015-08-01 NOTE — Telephone Encounter (Signed)
Unable to reach by phone or mail.  Chart closed.   

## 2016-11-30 ENCOUNTER — Encounter (HOSPITAL_COMMUNITY): Payer: Self-pay | Admitting: Emergency Medicine

## 2016-11-30 ENCOUNTER — Emergency Department (HOSPITAL_COMMUNITY)
Admission: EM | Admit: 2016-11-30 | Discharge: 2016-11-30 | Disposition: A | Discharge status: Home / Self Care | Payer: Self-pay | Attending: Emergency Medicine | Admitting: Emergency Medicine

## 2016-11-30 ENCOUNTER — Emergency Department (HOSPITAL_COMMUNITY): Payer: Self-pay

## 2016-11-30 DIAGNOSIS — H539 Unspecified visual disturbance: Secondary | ICD-10-CM

## 2016-11-30 DIAGNOSIS — K219 Gastro-esophageal reflux disease without esophagitis: Secondary | ICD-10-CM | POA: Insufficient documentation

## 2016-11-30 DIAGNOSIS — Z79899 Other long term (current) drug therapy: Secondary | ICD-10-CM | POA: Insufficient documentation

## 2016-11-30 DIAGNOSIS — R55 Syncope and collapse: Secondary | ICD-10-CM | POA: Insufficient documentation

## 2016-11-30 DIAGNOSIS — H538 Other visual disturbances: Secondary | ICD-10-CM | POA: Insufficient documentation

## 2016-11-30 DIAGNOSIS — F17210 Nicotine dependence, cigarettes, uncomplicated: Secondary | ICD-10-CM | POA: Insufficient documentation

## 2016-11-30 LAB — CBC WITH DIFFERENTIAL/PLATELET
Basophils Absolute: 0 10*3/uL (ref 0.0–0.1)
Basophils Relative: 0 %
Eosinophils Absolute: 0.2 10*3/uL (ref 0.0–0.7)
Eosinophils Relative: 2 %
HEMATOCRIT: 44.2 % (ref 36.0–46.0)
HEMOGLOBIN: 15.1 g/dL — AB (ref 12.0–15.0)
LYMPHS ABS: 2 10*3/uL (ref 0.7–4.0)
LYMPHS PCT: 29 %
MCH: 30.4 pg (ref 26.0–34.0)
MCHC: 34.2 g/dL (ref 30.0–36.0)
MCV: 88.9 fL (ref 78.0–100.0)
MONOS PCT: 7 %
Monocytes Absolute: 0.5 10*3/uL (ref 0.1–1.0)
NEUTROS ABS: 4.2 10*3/uL (ref 1.7–7.7)
NEUTROS PCT: 62 %
Platelets: 192 10*3/uL (ref 150–400)
RBC: 4.97 MIL/uL (ref 3.87–5.11)
RDW: 12.5 % (ref 11.5–15.5)
WBC: 6.8 10*3/uL (ref 4.0–10.5)

## 2016-11-30 LAB — COMPREHENSIVE METABOLIC PANEL
ALT: 19 U/L (ref 14–54)
ANION GAP: 8 (ref 5–15)
AST: 25 U/L (ref 15–41)
Albumin: 4.5 g/dL (ref 3.5–5.0)
Alkaline Phosphatase: 70 U/L (ref 38–126)
BUN: 21 mg/dL — ABNORMAL HIGH (ref 6–20)
CHLORIDE: 106 mmol/L (ref 101–111)
CO2: 25 mmol/L (ref 22–32)
Calcium: 9.6 mg/dL (ref 8.9–10.3)
Creatinine, Ser: 0.88 mg/dL (ref 0.44–1.00)
GFR calc non Af Amer: >60 mL/min (ref >60)
Glucose, Bld: 89 mg/dL (ref 65–99)
POTASSIUM: 4 mmol/L (ref 3.5–5.1)
SODIUM: 139 mmol/L (ref 135–145)
Total Bilirubin: 0.6 mg/dL (ref 0.3–1.2)
Total Protein: 7.8 g/dL (ref 6.5–8.1)

## 2016-11-30 LAB — HCG, QUANTITATIVE, PREGNANCY: hCG, Beta Chain, Quant, S: 1 m[IU]/mL (ref <5)

## 2016-11-30 LAB — LIPASE, BLOOD: Lipase: 24 U/L (ref 11–51)

## 2016-11-30 MED ORDER — FAMOTIDINE 20 MG PO TABS: 20.0000 mg | ORAL_TABLET | Freq: Two times a day (BID) | ORAL | 0 refills | Status: DC

## 2016-11-30 NOTE — ED Notes (Signed)
Call to lab- they are on the floor and will come here next

## 2016-11-30 NOTE — ED Provider Notes (Addendum)
[facility]Y DEPT Provider Note   CSN: [id number] Arrival date & time: 11/30/16  1413     History   Chief Complaint Chief Complaint  Patient presents with  . Loss of Vision    HPI Jillian Oneill is a 36 y.o. female.  Patient currently asymptomatic. Blood presents with intermittent complaint of some chest pain radiating into the jaw and neck. Along with an acid taste. None present currently. Also with complaint of lightheadedness and near syncopal feeling for the past 2-3 days. Associated with some blurred vision intermittently. Patient sometimes has reflux so bad that it makes her vomit. The reflux symptoms in the intermittent chest discomforts been present for 1 month. No shortness of breath.      Past Medical History:  Diagnosis Date  . Kidney stones     There are no active problems to display for this patient.   Past Surgical History:  Procedure Laterality Date  . CESAREAN SECTION    . TUBAL LIGATION      OB History    Gravida Para Term Preterm AB Living             2   SAB TAB Ectopic Multiple Live Births                   Home Medications    Prior to Admission medications   Medication Sig Start Date End Date Taking? Authorizing Provider  acetaminophen (TYLENOL) 500 MG tablet Take 1,000 mg by mouth every 6 (six) hours as needed.   Yes [provider]  famotidine (PEPCID) 20 MG tablet Take 1 tablet (20 mg total) by mouth 2 (two) times daily. 11/30/16   Vanetta Mulders, MD    Family History Family History  Problem Relation Age of Onset  . Hypertension Mother   . Hypertension Father     Social History Social History  Substance Use Topics  . Smoking status: Current Every Day Smoker    Packs/day: 0.25    Types: Cigarettes  . Smokeless tobacco: Not on file  . Alcohol use No     Allergies   Penicillins   Review of Systems Review of Systems  Constitutional: Negative for fever.  HENT: Negative for congestion.   Eyes: Positive  for visual disturbance.  Respiratory: Negative for shortness of breath.   Cardiovascular: Positive for chest pain.  Gastrointestinal: Positive for vomiting. Negative for abdominal pain.  Musculoskeletal: Positive for neck pain.  Skin: Negative for rash.  Neurological: Positive for light-headedness. Negative for syncope, speech difficulty, weakness, numbness and headaches.  Hematological: Does not bruise/bleed easily.  Psychiatric/Behavioral: Negative for confusion.     Physical Exam Updated Vital Signs BP 108/61   Pulse 70   Temp 97.9 F (36.6 C) (Oral)   Resp 20   Ht 5\' 2"  (1.575 m)   Wt 86.2 kg   SpO2 97%   BMI 34.75 kg/m   Physical Exam  Constitutional: She is oriented to person, place, and time. She appears well-developed and well-nourished. No distress.  HENT:  Head: Normocephalic and atraumatic.  Mouth/Throat: Oropharynx is clear and moist.  Eyes: Conjunctivae and EOM are normal. Pupils are equal, round, and reactive to light.  Neck: Normal range of motion. Neck supple.  Cardiovascular: Normal rate, regular rhythm and normal heart sounds.   Pulmonary/Chest: Effort normal and breath sounds normal. No respiratory distress.  Abdominal: Soft. Bowel sounds are normal. There is no tenderness.  Musculoskeletal: Normal range of motion. She exhibits no edema.  Neurological: She is alert and oriented to person, place, and time. No cranial nerve deficit or sensory deficit. She exhibits normal muscle tone. Coordination normal.  Skin: Skin is warm. No rash noted.  Nursing note and vitals reviewed.    ED Treatments / Results  Labs (all labs ordered are listed, but only abnormal results are displayed) Labs Reviewed  COMPREHENSIVE METABOLIC PANEL - Abnormal; Notable for the following:       Result Value   BUN 21 (*)    All other components within normal limits  CBC WITH DIFFERENTIAL/PLATELET - Abnormal; Notable for the following:    Hemoglobin 15.1 (*)    All other  components within normal limits  LIPASE, BLOOD  HCG, QUANTITATIVE, PREGNANCY    EKG  EKG Interpretation  Date/Time:  Saturday Nov 30 2016 16:49:16 EDT Ventricular Rate:  60 PR Interval:    QRS Duration: 124 QT Interval:  423 QTC Calculation: 423 R Axis:   64 Text Interpretation:  Sinus rhythm Atrial premature complex Nonspecific intraventricular conduction delay Borderline T abnormalities, diffuse leads Artifact No significant change since last tracing Confirmed by Chivas Notz  MD, Kassadi Presswood ([id number]) on 11/30/2016 4:53:27 PM       Radiology Dg Chest 2 View  Result Date: 11/30/2016 CLINICAL DATA:  Intermittent chest pain EXAM: CHEST  2 VIEW COMPARISON:  None. FINDINGS: The heart size and mediastinal contours are within normal limits. Both lungs are clear. The visualized skeletal structures are unremarkable. IMPRESSION: No active cardiopulmonary disease. Electronically Signed   By: Signa Kellaylor  Stroud M.D.   On: 11/30/2016 15:15   Ct Head Wo Contrast  Result Date: 11/30/2016 CLINICAL DATA:  Near syncope. EXAM: CT HEAD WITHOUT CONTRAST TECHNIQUE: Contiguous axial images were obtained from the base of the skull through the vertex without intravenous contrast. COMPARISON:  None. FINDINGS: Brain: No evidence of acute infarction, hemorrhage, hydrocephalus, extra-axial collection or mass lesion/mass effect. Vascular: No hyperdense vessel or unexpected calcification. Skull: Normal. Negative for fracture or focal lesion. Sinuses/Orbits: No acute finding. Other: None. IMPRESSION: Normal head CT. Electronically Signed   By: Lupita RaiderJames  Green Jr, M.D.   On: 11/30/2016 15:31    Procedures Procedures (including critical care time)  Medications Ordered in ED Medications - No data to display   Initial Impression / Assessment and Plan / ED Course  I have reviewed the triage vital signs and the nursing notes.  Pertinent labs & imaging results that were available during my care of the patient were reviewed by me and  considered in my medical decision making (see chart for details).     Currently patient asymptomatic no acute distress. Workup without acute findings. Head CT negative pregnancy test negative chest x-ray negative basic labs including liver function tests without acute findings. EKG without any significant findings. Symptoms seem to be consistent in part to gastroesophageal reflux. Not able to explain the visual changes or the near syncope feeling. Patient will require follow-up for further evaluation of the complaints. Patient given referral to GI medicine as well as neurology and also recommend optometry evaluation.  Since the reflux symptoms clinically seem to be definitely present will do a two-week course of Pepcid.  Final Clinical Impressions(s) / ED Diagnoses   Final diagnoses:  Near syncope  Vision changes  Gastroesophageal reflux disease, esophagitis presence not specified    New Prescriptions New Prescriptions   FAMOTIDINE (PEPCID) 20 MG TABLET    Take 1 tablet (20 mg total) by mouth 2 (two) times daily.  Vanetta Mulders, MD 11/30/16 1649    Vanetta Mulders, MD 11/30/16 [phone]

## 2016-11-30 NOTE — ED Notes (Signed)
Dr Z in to assess 

## 2016-11-30 NOTE — Discharge Instructions (Signed)
Recommend follow-up with an optometrist for visual check. Today's workup without any acute findings. Everything checks out normal. Return for any new or worse symptoms. If symptoms persist referral information to neurology provided.  Trial of the medication Pepcid for the next 2 weeks. Call GI medicine for follow-up.

## 2016-11-30 NOTE — ED Notes (Signed)
To radiology- pt is conversant laughing and appears in no distress

## 2016-11-30 NOTE — ED Triage Notes (Signed)
Pt reports intermittent chest pain, denies currently.  States she has had 2 episodes where she is walking and her vision gets really blurry and she feels like she is going to black out.  Denies this as well currently.

## 2017-01-23 ENCOUNTER — Encounter: Payer: Self-pay | Admitting: Emergency Medicine

## 2017-01-23 ENCOUNTER — Emergency Department
Admission: EM | Admit: 2017-01-23 | Discharge: 2017-01-23 | Disposition: A | Discharge status: Home / Self Care | Payer: Self-pay | Attending: Emergency Medicine | Admitting: Emergency Medicine

## 2017-01-23 ENCOUNTER — Emergency Department: Payer: Self-pay

## 2017-01-23 DIAGNOSIS — S91209A Unspecified open wound of unspecified toe(s) with damage to nail, initial encounter: Secondary | ICD-10-CM

## 2017-01-23 DIAGNOSIS — Y939 Activity, unspecified: Secondary | ICD-10-CM | POA: Insufficient documentation

## 2017-01-23 DIAGNOSIS — Y998 Other external cause status: Secondary | ICD-10-CM | POA: Insufficient documentation

## 2017-01-23 DIAGNOSIS — S91211A Laceration without foreign body of right great toe with damage to nail, initial encounter: Secondary | ICD-10-CM | POA: Insufficient documentation

## 2017-01-23 DIAGNOSIS — Y929 Unspecified place or not applicable: Secondary | ICD-10-CM | POA: Insufficient documentation

## 2017-01-23 DIAGNOSIS — Z79899 Other long term (current) drug therapy: Secondary | ICD-10-CM | POA: Insufficient documentation

## 2017-01-23 DIAGNOSIS — W208XXA Other cause of strike by thrown, projected or falling object, initial encounter: Secondary | ICD-10-CM | POA: Insufficient documentation

## 2017-01-23 DIAGNOSIS — F17210 Nicotine dependence, cigarettes, uncomplicated: Secondary | ICD-10-CM | POA: Insufficient documentation

## 2017-01-23 MED ORDER — LIDOCAINE-EPINEPHRINE-TETRACAINE (LET) SOLUTION
3.0000 mL | Freq: Once | NASAL | Status: DC
  Filled 2017-01-23: qty 3

## 2017-01-23 MED ORDER — SULFAMETHOXAZOLE-TRIMETHOPRIM 800-160 MG PO TABS: 1.0000 | ORAL_TABLET | Freq: Two times a day (BID) | ORAL | 0 refills | Status: AC

## 2017-01-23 NOTE — ED Triage Notes (Signed)
Patient presents to ED via POV from [employer] after dropping an 8 pound jar of baked beans on her right great toe. Patient states, "My toe nail is split in 1/2. It was bleeding everywhere".

## 2017-01-23 NOTE — ED Provider Notes (Signed)
[facility] Emergency Department Provider Note  ____________________________________________  Time seen: Approximately 4:36 PM  I have reviewed the triage vital signs and the nursing notes.   HISTORY  Chief Complaint Toe Pain    HPI Jillian Oneill is a 36 y.o. female presenting to the emergency department with 7 out of 10 right great toe pain after patient [place] [employer] [place]. Incident caused right great toenail avulsion. Pain is worsened with exertion and relieved with rest. She describes pain as throbbing and nonradiating. Patient denies radiculopathy, weakness or changes in sensation of the right great toe. Patient was able to ambulate after the incident.   Past Medical History:  Diagnosis Date  . Kidney stones     There are no active problems to display for this patient.   Past Surgical History:  Procedure Laterality Date  . CESAREAN SECTION    . TUBAL LIGATION      Prior to Admission medications   Medication Sig Start Date End Date Taking? Authorizing Provider  acetaminophen (TYLENOL) 500 MG tablet Take 1,000 mg by mouth every 6 (six) hours as needed.    [provider]  famotidine (PEPCID) 20 MG tablet Take 1 tablet (20 mg total) by mouth 2 (two) times daily. 11/30/16   Vanetta MuldersZackowski, Scott, MD  sulfamethoxazole-trimethoprim (BACTRIM DS,SEPTRA DS) 800-160 MG tablet Take 1 tablet by mouth 2 (two) times daily. 01/23/17 01/30/17  Orvil FeilWoods, Jaclyn M, PA-C    Allergies Penicillins  Family History  Problem Relation Age of Onset  . Hypertension Mother   . Hypertension Father     Social History Social History  Substance Use Topics  . Smoking status: Current Every Day Smoker    Packs/day: 0.25    Types: Cigarettes  . Smokeless tobacco: Never Used  . Alcohol use No     Review of Systems  Constitutional: No fever/chills Eyes: No visual changes. No discharge ENT: No upper respiratory  complaints. Cardiovascular: no chest pain. Respiratory: no cough. No SOB. Musculoskeletal: Patient has right great toe pain.  Skin: Patient has midline avulsion of right great toenail with small skin laceration.  Neurological: Negative for headaches, focal weakness or numbness.   ____________________________________________   PHYSICAL EXAM:  VITAL SIGNS: ED Triage Vitals  Enc Vitals Group     BP 01/23/17 1511 (!) 116/96     Pulse Rate 01/23/17 1511 82     Resp 01/23/17 1511 15     Temp 01/23/17 1511 99.9 F (37.7 C)     Temp Source 01/23/17 1511 Oral     SpO2 01/23/17 1511 98 %     Weight 01/23/17 1508 185 lb (83.9 kg)     Height 01/23/17 1508 5\' 2"  (1.575 m)     Head Circumference --      Peak Flow --      Pain Score 01/23/17 1508 7     Pain Loc --      Pain Edu? --      Excl. in GC? --      Constitutional: Alert and oriented. Well appearing and in no acute distress. Eyes: Conjunctivae are normal. PERRL. EOMI. Head: Atraumatic. Cardiovascular: Normal rate, regular rhythm. Normal S1 and S2.  Good peripheral circulation. Respiratory: Normal respiratory effort without tachypnea or retractions. Lungs CTAB. Good air entry to the bases with no decreased or absent breath sounds. Musculoskeletal: Patient has 5 out of 5 strength in the lower extremities bilaterally. Patient exhibits  full range of motion at the right ankle, right knee and right hip. Patient is able to move all 5 right toes. Palpable dorsalis pedis pulse bilaterally and symmetrically. Neurologic:  Normal speech and language. No gross focal neurologic deficits are appreciated.  Skin:  Skin overlying right great toe is warm. Patient has a midline right great toenail avulsion with a small 0.5 cm skin laceration.  Psychiatric: Mood and affect are normal. Speech and behavior are normal. Patient exhibits appropriate insight and judgement.   ____________________________________________   LABS (all labs ordered are  listed, but only abnormal results are displayed)  Labs Reviewed - No data to display ____________________________________________  EKG   ____________________________________________  RADIOLOGY Geraldo Pitter, personally viewed and evaluated these images (plain radiographs) as part of my medical decision making, as well as reviewing the written report by the radiologist.  Dg Foot Complete Right  Result Date: 01/23/2017 CLINICAL DATA:  Injury. EXAM: RIGHT FOOT COMPLETE - 3+ VIEW COMPARISON:  No recent prior. FINDINGS: No acute bony or joint abnormality identified. No evidence of fracture dislocation.Small sclerotic density noted in the proximal phalanx of the right great toe in the distal aspect of the right third metatarsal may represent a bone island. IMPRESSION: No acute abnormality. Electronically Signed   By: Maisie Fus  Register   On: 01/23/2017 16:07    ____________________________________________    PROCEDURES  Procedure(s) performed:    Procedures   LACERATION REPAIR Performed by: Orvil Feil Authorized by: Orvil Feil Consent: Verbal consent obtained. Risks and benefits: risks, benefits and alternatives were discussed Consent given by: patient Patient identity confirmed: provided demographic data Prepped and Draped in normal sterile fashion Wound explored  Laceration Location: Right Great Toe   Laceration Length: 0.5 cm  No Foreign Bodies seen or palpated  Anesthesia: local infiltration  Local anesthetic: LET  Anesthetic total: 3 ml  Irrigation method: syringe Amount of cleaning: standard  Skin closure: Dermabond   Patient tolerance: Patient tolerated the procedure well with no immediate complications.   Medications  lidocaine-EPINEPHrine-tetracaine (LET) solution (not administered)     ____________________________________________   INITIAL IMPRESSION / ASSESSMENT AND PLAN / ED COURSE  Pertinent labs & imaging results that were  available during my care of the patient were reviewed by me and considered in my medical decision making (see chart for details).  Review of the [place] CSRS was performed in accordance of the [place] prior to dispensing any controlled drugs.     Assessment and plan: Right great toenail avulsion: Patient presents to the emergency department with right great toe pain and toenail avulsion. DG right foot revealed no acute fractures or bony abnormalities. Patient declined toenail removal in the emergency department or repair of right great toe laceration with suture. She underwent laceration repair with Dermabond. Patient states "I'm real funny about my feet". LET was applied in the emergency department and a pressure dressing was applied. A referral was made to podiatry. She was discharged with Bactrim. All patient questions were answered.  ____________________________________________  FINAL CLINICAL IMPRESSION(S) / ED DIAGNOSES  Final diagnoses:  Avulsion of toenail, initial encounter      NEW MEDICATIONS STARTED DURING THIS VISIT:  New Prescriptions   SULFAMETHOXAZOLE-TRIMETHOPRIM (BACTRIM DS,SEPTRA DS) 800-160 MG TABLET    Take 1 tablet by mouth 2 (two) times daily.        This chart was dictated using voice recognition software/Dragon. Despite best efforts to proofread, errors can occur which can change the meaning. Any change  was purely unintentional.    Orvil Feil, PA-C 01/23/17 1718    Jeanmarie Plant, MD 01/23/17 1945

## 2017-01-27 ENCOUNTER — Encounter (HOSPITAL_COMMUNITY): Payer: Self-pay | Admitting: Emergency Medicine

## 2017-01-27 ENCOUNTER — Emergency Department (HOSPITAL_COMMUNITY)
Admission: EM | Admit: 2017-01-27 | Discharge: 2017-01-27 | Disposition: A | Discharge status: Home / Self Care | Payer: Self-pay | Attending: Emergency Medicine | Admitting: Emergency Medicine

## 2017-01-27 DIAGNOSIS — S99922A Unspecified injury of left foot, initial encounter: Secondary | ICD-10-CM

## 2017-01-27 DIAGNOSIS — F17210 Nicotine dependence, cigarettes, uncomplicated: Secondary | ICD-10-CM | POA: Insufficient documentation

## 2017-01-27 DIAGNOSIS — M79674 Pain in right toe(s): Secondary | ICD-10-CM | POA: Insufficient documentation

## 2017-01-27 DIAGNOSIS — Z79899 Other long term (current) drug therapy: Secondary | ICD-10-CM | POA: Insufficient documentation

## 2017-01-27 MED ORDER — BACITRACIN ZINC 500 UNIT/GM EX OINT
1.0000 "application " | TOPICAL_OINTMENT | Freq: Two times a day (BID) | CUTANEOUS | Status: DC
  Administered 2017-01-27: 1 via TOPICAL
  Filled 2017-01-27: qty 0.9

## 2017-01-27 MED ORDER — IBUPROFEN 800 MG PO TABS: 800.0000 mg | ORAL_TABLET | Freq: Three times a day (TID) | ORAL | 0 refills | Status: DC

## 2017-01-27 NOTE — ED Provider Notes (Signed)
[facility]EMER[facility]Y DEPT Provider Note   CSN: [id number] Arrival date & time: 01/27/17  1024     History   Chief Complaint Chief Complaint  Patient presents with  . Foot Pain    HPI Jillian Oneill is a 36 y.o. female.   Foot Pain     The patient was recently seen after suffering an injury to her right great toe when she dropped a can on the toe. She had a laceration to the toenail and likely the nail bed but she refused treatment on June 28 as far as toe nail removal and repair of an underlying laceration. She was placed on Bactrim and encouraged to follow up with podiatrist with whom she has a follow-up this Friday. At this time the patient states she has ongoing mild pain, mild swelling, nothing is changed. She is requesting a work note, no other complaints  Past Medical History:  Diagnosis Date  . Kidney stones     There are no active problems to display for this patient.   Past Surgical History:  Procedure Laterality Date  . CESAREAN SECTION    . TUBAL LIGATION      OB History    Gravida Para Term Preterm AB Living             2   SAB TAB Ectopic Multiple Live Births                   Home Medications    Prior to Admission medications   Medication Sig Start Date End Date Taking? Authorizing Provider  acetaminophen (TYLENOL) 500 MG tablet Take 1,000 mg by mouth every 6 (six) hours as needed.   Yes [provider]  famotidine (PEPCID) 20 MG tablet Take 1 tablet (20 mg total) by mouth 2 (two) times daily. 11/30/16  Yes Vanetta MuldersZackowski, Scott, MD  sulfamethoxazole-trimethoprim (BACTRIM DS,SEPTRA DS) 800-160 MG tablet Take 1 tablet by mouth 2 (two) times daily. 01/23/17 01/30/17 Yes Pia MauWoods, Jaclyn M, PA-C  ibuprofen (ADVIL,MOTRIN) 800 MG tablet Take 1 tablet (800 mg total) by mouth 3 (three) times daily. 01/27/17   Eber HongMiller, Sheva Mcdougle, MD    Family History Family History  Problem Relation Age of Onset  . Hypertension Mother   . Hypertension Father     Social  History Social History  Substance Use Topics  . Smoking status: Current Every Day Smoker    Packs/day: 0.25    Types: Cigarettes  . Smokeless tobacco: Never Used  . Alcohol use No     Allergies   Penicillins   Review of Systems Review of Systems  Constitutional: Negative for fever.  Skin: Positive for wound.     Physical Exam Updated Vital Signs BP 112/77 (BP Location: Left Arm)   Pulse 90   Temp 98 F (36.7 C) (Oral)   Resp 18   SpO2 99%   Physical Exam  Constitutional: She appears well-developed and well-nourished.  HENT:  Head: Normocephalic and atraumatic.  Eyes: Conjunctivae are normal. Right eye exhibits no discharge. Left eye exhibits no discharge.  Pulmonary/Chest: Effort normal. No respiratory distress.  Neurological: She is alert. Coordination normal.  Skin: Skin is warm and dry. No rash noted. She is not diaphoretic. No erythema.  Laceration through the toenail of the right great toe extending onto the skin distal to the nail. Granulation tissue seen, no other acute findings, no purulent drainage, no redness of the skin around this  Psychiatric: She has a normal mood and affect.  Nursing  note and vitals reviewed.    ED Treatments / Results  Labs (all labs ordered are listed, but only abnormal results are displayed) Labs Reviewed - No data to display   Radiology No results found.  Procedures Procedures (including critical care time)  Medications Ordered in ED Medications - No data to display   Initial Impression / Assessment and Plan / ED Course  I have reviewed the triage vital signs and the nursing notes.  Pertinent labs & imaging results that were available during my care of the patient were reviewed by me and considered in my medical decision making (see chart for details).     Postop shoe, ibuprofen, continue warm soaks, podiatry on Friday. Cannot repair wound at this time  Final Clinical Impressions(s) / ED Diagnoses   Final  diagnoses:  Toe injury, left, initial encounter    New Prescriptions New Prescriptions   IBUPROFEN (ADVIL,MOTRIN) 800 MG TABLET    Take 1 tablet (800 mg total) by mouth 3 (three) times daily.     Eber Hong, MD 01/27/17 1131

## 2017-01-27 NOTE — ED Triage Notes (Signed)
Pt had can fall onto right foot x 4 days ago. Seen at [facility]. 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 Pt concerned if she need tetanus shot also.

## 2017-01-27 NOTE — Discharge Instructions (Signed)
Podiatry on Friday Wear post op shoe Apply topical antibiotics ointment to the toe twice daily Clean and dry for 3 days Soak in warm soapy water twice daily

## 2017-09-28 ENCOUNTER — Emergency Department (HOSPITAL_COMMUNITY): Payer: Self-pay

## 2017-09-28 ENCOUNTER — Emergency Department (HOSPITAL_COMMUNITY)
Admission: EM | Admit: 2017-09-28 | Discharge: 2017-09-28 | Disposition: A | Discharge status: Home / Self Care | Payer: Self-pay | Attending: Emergency Medicine | Admitting: Emergency Medicine

## 2017-09-28 ENCOUNTER — Encounter (HOSPITAL_COMMUNITY): Payer: Self-pay | Admitting: Emergency Medicine

## 2017-09-28 ENCOUNTER — Other Ambulatory Visit: Payer: Self-pay

## 2017-09-28 DIAGNOSIS — F17210 Nicotine dependence, cigarettes, uncomplicated: Secondary | ICD-10-CM | POA: Insufficient documentation

## 2017-09-28 DIAGNOSIS — M7702 Medial epicondylitis, left elbow: Secondary | ICD-10-CM | POA: Insufficient documentation

## 2017-09-28 DIAGNOSIS — Z79899 Other long term (current) drug therapy: Secondary | ICD-10-CM | POA: Insufficient documentation

## 2017-09-28 DIAGNOSIS — F17200 Nicotine dependence, unspecified, uncomplicated: Secondary | ICD-10-CM

## 2017-09-28 MED ORDER — ALBUTEROL SULFATE HFA 108 (90 BASE) MCG/ACT IN AERS: 1.0000 | INHALATION_SPRAY | Freq: Four times a day (QID) | RESPIRATORY_TRACT | 2 refills | Status: AC | PRN

## 2017-09-28 NOTE — ED Provider Notes (Signed)
[facility] Provider Note   CSN: [id number] Arrival date & time: 09/28/17  1615     History   Chief Complaint Chief Complaint  Patient presents with  . Shortness of Breath    HPI Jillian Oneill is a 37 y.o. female.  Patient has 2 concerns today.  #1: Tenderness in her medial left elbow for an unknown length of time.  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  #2: Patient has been short of breath for several years.  She smokes cigarettes.  No substernal chest pain.  She becomes more dyspneic after walking a mile.  No fever, sweats, chills, weight loss.      Past Medical History:  Diagnosis Date  . Kidney stones     There are no active problems to display for this patient.   Past Surgical History:  Procedure Laterality Date  . CESAREAN SECTION    . TUBAL LIGATION      OB History    Gravida Para Term Preterm AB Living             2   SAB TAB Ectopic Multiple Live Births                   Home Medications    Prior to Admission medications   Medication Sig Start Date End Date Taking? Authorizing Provider  acetaminophen (TYLENOL) 500 MG tablet Take 1,000 mg by mouth every 6 (six) hours as needed.   Yes [provider]  guaiFENesin (MUCINEX) 600 MG 12 hr tablet Take 600 mg by mouth 2 (two) times daily as needed.   Yes [provider]  albuterol (PROVENTIL HFA;VENTOLIN HFA) 108 (90 Base) MCG/ACT inhaler Inhale 1-2 puffs into the lungs every 6 (six) hours as needed for wheezing or shortness of breath. 09/28/17   Donnetta Hutchingook, Ashunti Schofield, MD    Family History Family History  Problem Relation Age of Onset  . Hypertension Mother   . Hypertension Father     Social History Social History   Tobacco Use  . Smoking status: Current Every Day Smoker    Packs/day: 0.50    Types: Cigarettes  . Smokeless tobacco: Never Used  Substance Use Topics  . Alcohol use: No  . Drug use: No     Allergies     Penicillins   Review of Systems Review of Systems  All other systems reviewed and are negative.    Physical Exam Updated Vital Signs BP 125/81 (BP Location: Right Arm)   Pulse 97   Temp 98.1 F (36.7 C) (Oral)   Resp 16   Ht 5\' 2"  (1.575 m)   Wt 86.2 kg (190 lb)   SpO2 97%   BMI 34.75 kg/m   Physical Exam  Constitutional: She is oriented to person, place, and time. She appears well-developed and well-nourished.  Elevated BMI, no acute distress  HENT:  Head: Normocephalic and atraumatic.  Eyes: Conjunctivae are normal.  Neck: Neck supple.  Cardiovascular: Normal rate and regular rhythm.  Pulmonary/Chest: Effort normal and breath sounds normal.  Abdominal: Soft. Bowel sounds are normal.  Musculoskeletal: Normal range of motion.  Minimal tenderness on the medial epicondyle area of the left elbow  Neurological: She is alert and oriented to person, place, and time.  Skin: Skin is warm and dry.  Psychiatric: She has a normal mood and affect. Her behavior is normal.  Nursing note and vitals reviewed.    ED Treatments / Results  Labs (all labs ordered are listed, but only abnormal results are displayed) Labs Reviewed - No data to display  EKG  EKG Interpretation None       Radiology Dg Chest 2 View  Result Date: 09/28/2017 CLINICAL DATA:  Increased shortness of breath. EXAM: CHEST  2 VIEW COMPARISON:  11/30/2016; 12/18/2011 FINDINGS: Grossly unchanged cardiac silhouette and mediastinal contours. No focal parenchymal opacities. No pleural effusion or pneumothorax. No evidence of edema. No acute osseus abnormalities. IMPRESSION: No acute cardiopulmonary disease. Electronically Signed   By: Simonne Come M.D.   On: 09/28/2017 17:01   Dg Elbow Complete Left  Result Date: 09/28/2017 CLINICAL DATA:  Posterior left elbow pain for the past 3 months. No known injury. EXAM: LEFT ELBOW - COMPLETE 3+ VIEW COMPARISON:  None. FINDINGS: No fracture or elbow joint effusion. Joint  spaces are preserved. No erosions. Regional soft tissues appear normal. No radiopaque foreign body. IMPRESSION: Normal radiographs of the left elbow. Electronically Signed   By: Simonne Come M.D.   On: 09/28/2017 17:00    Procedures Procedures (including critical care time)  Medications Ordered in ED Medications - No data to display   Initial Impression / Assessment and Plan / ED Course  I have reviewed the triage vital signs and the nursing notes.  Pertinent labs & imaging results that were available during my care of the patient were reviewed by me and considered in my medical decision making (see chart for details).     Patient is in no acute distress.  Plain films of her chest and left elbow were negative.  I suspect medial epicondylitis of her left elbow.  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  She is also  overweight and deconditioned.  Will Rx albuterol MDI.  Patient encouraged to stop smoking and start exercising.  Also recommended a epicondylitis tendon sheath Final Clinical Impressions(s) / ED Diagnoses   Final diagnoses:  Medial epicondylitis of left elbow  Smoking    ED Discharge Orders        Ordered    albuterol (PROVENTIL HFA;VENTOLIN HFA) 108 (90 Base) MCG/ACT inhaler  Every 6 hours PRN     09/28/17 1745       Donnetta Hutching, MD 09/28/17 1752

## 2017-09-28 NOTE — ED Triage Notes (Signed)
Pt reports increased SOB and LT arm elbow that radiates down to fingertips x 3 months. NAD noted. Denies CP.

## 2017-09-28 NOTE — Discharge Instructions (Signed)
I believe you have tendinitis in your left elbow.  This is a result of overuse of the elbow.  Recommend Tylenol and/or ibuprofen.  Ice pack.  Elbow brace like we discussed.  Strongly recommend stop smoking.  Get regular exercise and try to eat a better diet.  Prescription for inhaler.

## 2017-10-28 ENCOUNTER — Encounter (HOSPITAL_COMMUNITY): Payer: Self-pay | Admitting: Emergency Medicine

## 2017-10-28 ENCOUNTER — Other Ambulatory Visit: Payer: Self-pay

## 2017-10-28 ENCOUNTER — Emergency Department (HOSPITAL_COMMUNITY)
Admission: EM | Admit: 2017-10-28 | Discharge: 2017-10-28 | Disposition: A | Discharge status: Home / Self Care | Payer: Self-pay | Attending: Emergency Medicine | Admitting: Emergency Medicine

## 2017-10-28 DIAGNOSIS — R69 Illness, unspecified: Secondary | ICD-10-CM

## 2017-10-28 DIAGNOSIS — J111 Influenza due to unidentified influenza virus with other respiratory manifestations: Secondary | ICD-10-CM | POA: Insufficient documentation

## 2017-10-28 DIAGNOSIS — R05 Cough: Secondary | ICD-10-CM | POA: Insufficient documentation

## 2017-10-28 DIAGNOSIS — M7918 Myalgia, other site: Secondary | ICD-10-CM | POA: Insufficient documentation

## 2017-10-28 DIAGNOSIS — F17210 Nicotine dependence, cigarettes, uncomplicated: Secondary | ICD-10-CM | POA: Insufficient documentation

## 2017-10-28 DIAGNOSIS — Z79899 Other long term (current) drug therapy: Secondary | ICD-10-CM | POA: Insufficient documentation

## 2017-10-28 MED ORDER — ACETAMINOPHEN ER 650 MG PO TBCR: 650.0000 mg | EXTENDED_RELEASE_TABLET | Freq: Three times a day (TID) | ORAL | 0 refills | Status: AC

## 2017-10-28 MED ORDER — IBUPROFEN 600 MG PO TABS: 600.0000 mg | ORAL_TABLET | Freq: Four times a day (QID) | ORAL | 0 refills | Status: DC | PRN

## 2017-10-28 MED ORDER — DIPHENHYDRAMINE HCL 25 MG PO CAPS: 25.0000 mg | ORAL_CAPSULE | Freq: Four times a day (QID) | ORAL | 0 refills | Status: DC | PRN

## 2017-10-28 MED ORDER — DIPHENHYDRAMINE HCL 25 MG PO CAPS: 25.0000 mg | ORAL_CAPSULE | Freq: Four times a day (QID) | ORAL | 0 refills | Status: AC | PRN

## 2017-10-28 MED ORDER — OSELTAMIVIR PHOSPHATE 75 MG PO CAPS: 75.0000 mg | ORAL_CAPSULE | Freq: Two times a day (BID) | ORAL | 0 refills | Status: DC

## 2017-10-28 NOTE — Discharge Instructions (Signed)
We think what you have is a viral syndrome, and it could be influenza. The treatment is symptom relief only, and your body will fight the infection off in a few days. We are prescribing you some meds for pain and fevers. See your primary care doctor in 1 week if the symptoms dont improve. Ensure you are drinking plenty of fluids.

## 2017-10-28 NOTE — ED Provider Notes (Signed)
[facility] Provider Note   CSN: [id number] Arrival date & time: 10/28/17  2115     History   Chief Complaint Chief Complaint  Patient presents with  . Fever    HPI Jillian Oneill is a 37 y.o. female.  HPI  37 year old female comes in with chief complaint of flulike symptoms.  Patient states that over the past 24 hours she has developed subjective fevers, chills, body aches, malaise and she is having URI like symptoms with some cough and shortness of breath.  Patient denies any wheezing and she does not have underlying lung disease.  Patient was exposed to someone at work who had flu just within the last week.   Past Medical History:  Diagnosis Date  . Kidney stones     There are no active problems to display for this patient.   Past Surgical History:  Procedure Laterality Date  . CESAREAN SECTION    . TUBAL LIGATION       OB History    Gravida      Para      Term      Preterm      AB      Living  2     SAB      TAB      Ectopic      Multiple      Live Births               Home Medications    Prior to Admission medications   Medication Sig Start Date End Date Taking? Authorizing Provider  acetaminophen (TYLENOL 8 HOUR) 650 MG CR tablet Take 1 tablet (650 mg total) by mouth every 8 (eight) hours. 10/28/17   Derwood KaplanNanavati, Dinna Severs, MD  albuterol (PROVENTIL HFA;VENTOLIN HFA) 108 (90 Base) MCG/ACT inhaler Inhale 1-2 puffs into the lungs every 6 (six) hours as needed for wheezing or shortness of breath. 09/28/17   Donnetta Hutchingook, Brian, MD  diphenhydrAMINE (BENADRYL) 25 mg capsule Take 1 capsule (25 mg total) by mouth every 6 (six) hours as needed for itching. 10/28/17   Derwood KaplanNanavati, Ruffus Kamaka, MD  guaiFENesin (MUCINEX) 600 MG 12 hr tablet Take 600 mg by mouth 2 (two) times daily as needed.    [provider]  ibuprofen (ADVIL,MOTRIN) 600 MG tablet Take 1 tablet (600 mg total) by mouth every 6 (six) hours as needed. 10/28/17   Derwood KaplanNanavati, Valorie Mcgrory, MD   oseltamivir (TAMIFLU) 75 MG capsule Take 1 capsule (75 mg total) by mouth every 12 (twelve) hours. 10/28/17   Derwood KaplanNanavati, Emslee Lopezmartinez, MD    Family History Family History  Problem Relation Age of Onset  . Hypertension Mother   . Hypertension Father     Social History Social History   Tobacco Use  . Smoking status: Current Every Day Smoker    Packs/day: 0.50    Types: Cigarettes  . Smokeless tobacco: Never Used  Substance Use Topics  . Alcohol use: No  . Drug use: No     Allergies   Penicillins   Review of Systems Review of Systems  Constitutional: Positive for chills and fatigue.  HENT: Positive for congestion.   Respiratory: Positive for cough and shortness of breath.   Gastrointestinal: Negative for abdominal pain.  Skin: Negative for rash.  Allergic/Immunologic: Negative for immunocompromised state.     Physical Exam Updated Vital Signs BP (!) 134/95 (BP Location: Right Arm)   Pulse 85   Temp 98.1 F (36.7 C) (Oral)   Resp 18  Ht 5\' 2"  (1.575 m)   Wt 86.2 kg (190 lb)   SpO2 99%   BMI 34.75 kg/m   Physical Exam  Constitutional: She is oriented to person, place, and time. She appears well-developed.  HENT:  Head: Normocephalic and atraumatic.  Eyes: EOM are normal.  Neck: Normal range of motion. Neck supple.  Cardiovascular: Normal rate.  Pulmonary/Chest: Effort normal. No stridor. No respiratory distress. She has no wheezes.  Abdominal: Bowel sounds are normal.  Neurological: She is alert and oriented to person, place, and time.  Skin: Skin is warm and dry.  Nursing note and vitals reviewed.    ED Treatments / Results  Labs (all labs ordered are listed, but only abnormal results are displayed) Labs Reviewed - No data to display  EKG None  Radiology No results found.  Procedures Procedures (including critical care time)  Medications Ordered in ED Medications - No data to display   Initial Impression / Assessment and Plan / ED Course  I  have reviewed the triage vital signs and the nursing notes.  Pertinent labs & imaging results that were available during my care of the patient were reviewed by me and considered in my medical decision making (see chart for details).     37 year old female comes in with chief complaints that are consistent with URI like symptoms, body aches and possibly flulike illness.  Patient also reports that she has had exposure to someone who had flu.  We will treat patient as if she had influenza with the constellation of symptoms she is having.  Lungs are clear therefore there is no clinical concern for pneumonia.  Patient is nontoxic and her vital signs here are normal.  No treatment undertaken at home.   Final Clinical Impressions(s) / ED Diagnoses   Final diagnoses:  Influenza-like illness    ED Discharge Orders        Ordered    ibuprofen (ADVIL,MOTRIN) 600 MG tablet  Every 6 hours PRN     10/28/17 2223    acetaminophen (TYLENOL 8 HOUR) 650 MG CR tablet  Every 8 hours     10/28/17 2223    diphenhydrAMINE (BENADRYL) 25 mg capsule  Every 6 hours PRN,   Status:  Discontinued     10/28/17 2223    diphenhydrAMINE (BENADRYL) 25 mg capsule  Every 6 hours PRN     10/28/17 2224    oseltamivir (TAMIFLU) 75 MG capsule  Every 12 hours     10/28/17 2224       Derwood Kaplan, MD 10/28/17 2251

## 2017-10-28 NOTE — ED Triage Notes (Signed)
Pt c/o fever, cough, generalized weakness and no appetite that started yesterday.

## 2018-01-27 ENCOUNTER — Encounter: Payer: Self-pay | Admitting: Emergency Medicine

## 2018-01-27 ENCOUNTER — Emergency Department
Admission: EM | Admit: 2018-01-27 | Discharge: 2018-01-27 | Disposition: A | Discharge status: Home / Self Care | Payer: Self-pay | Attending: Emergency Medicine | Admitting: Emergency Medicine

## 2018-01-27 ENCOUNTER — Other Ambulatory Visit: Payer: Self-pay

## 2018-01-27 DIAGNOSIS — M5442 Lumbago with sciatica, left side: Secondary | ICD-10-CM | POA: Insufficient documentation

## 2018-01-27 DIAGNOSIS — G8929 Other chronic pain: Secondary | ICD-10-CM | POA: Insufficient documentation

## 2018-01-27 DIAGNOSIS — F17210 Nicotine dependence, cigarettes, uncomplicated: Secondary | ICD-10-CM | POA: Insufficient documentation

## 2018-01-27 DIAGNOSIS — Z79899 Other long term (current) drug therapy: Secondary | ICD-10-CM | POA: Insufficient documentation

## 2018-01-27 LAB — URINALYSIS, COMPLETE (UACMP) WITH MICROSCOPIC
Bacteria, UA: NONE SEEN
Bilirubin Urine: NEGATIVE
Glucose, UA: NEGATIVE mg/dL
Hgb urine dipstick: NEGATIVE
Ketones, ur: 5 mg/dL — AB
Leukocytes, UA: NEGATIVE
Nitrite: NEGATIVE
Protein, ur: NEGATIVE mg/dL
Specific Gravity, Urine: 1.028 (ref 1.005–1.030)
pH: 6 (ref 5.0–8.0)

## 2018-01-27 LAB — POCT PREGNANCY, URINE: PREG TEST UR: NEGATIVE

## 2018-01-27 MED ORDER — KETOROLAC TROMETHAMINE 30 MG/ML IJ SOLN
30.0000 mg | Freq: Once | INTRAMUSCULAR | Status: AC
Start: 2018-01-27 — End: 2018-01-27
  Administered 2018-01-27: 30 mg via INTRAMUSCULAR
  Filled 2018-01-27: qty 1

## 2018-01-27 MED ORDER — METHOCARBAMOL 500 MG PO TABS: 500.0000 mg | ORAL_TABLET | Freq: Three times a day (TID) | ORAL | 0 refills | Status: AC | PRN

## 2018-01-27 MED ORDER — PREDNISONE 50 MG PO TABS: ORAL_TABLET | ORAL | 0 refills | Status: DC

## 2018-01-27 MED ORDER — PREDNISONE 20 MG PO TABS
60.0000 mg | ORAL_TABLET | Freq: Once | ORAL | Status: AC
  Administered 2018-01-27: 60 mg via ORAL
  Filled 2018-01-27: qty 3

## 2018-01-27 NOTE — ED Provider Notes (Addendum)
[facility] Emergency Department Provider Note  ____________________________________________  Time seen: Approximately 9:30 PM  I have reviewed the triage vital signs and the nursing notes.   HISTORY  Chief Complaint Back Pain    HPI Jillian Oneill is a 37 y.o. female resents to the emergency department with 10 out of 10 bilateral low back pain with left lower extremity radiculopathy that has been bothering patient for the past several months.  Patient has a history of chronic low back pain.  She denies falls or mechanisms of injury.  She reports that pain is worsened with prolonged standing and relieved with rest.  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  She denies bowel or bladder incontinence or saddle anesthesia.  Patient has been taking ibuprofen at home.   Past Medical History:  Diagnosis Date  . Kidney stones     There are no active problems to display for this patient.   Past Surgical History:  Procedure Laterality Date  . CESAREAN SECTION    . CESAREAN SECTION    . TUBAL LIGATION      Prior to Admission medications   Medication Sig Start Date End Date Taking? Authorizing Provider  acetaminophen (TYLENOL 8 HOUR) 650 MG CR tablet Take 1 tablet (650 mg total) by mouth every 8 (eight) hours. 10/28/17   Derwood Kaplan, MD  albuterol (PROVENTIL HFA;VENTOLIN HFA) 108 (90 Base) MCG/ACT inhaler Inhale 1-2 puffs into the lungs every 6 (six) hours as needed for wheezing or shortness of breath. 09/28/17   Donnetta Hutching, MD  diphenhydrAMINE (BENADRYL) 25 mg capsule Take 1 capsule (25 mg total) by mouth every 6 (six) hours as needed for itching. 10/28/17   Derwood Kaplan, MD  guaiFENesin (MUCINEX) 600 MG 12 hr tablet Take 600 mg by mouth 2 (two) times daily as needed.    [provider]  ibuprofen (ADVIL,MOTRIN) 600 MG tablet Take 1 tablet (600 mg total) by mouth every 6 (six) hours  as needed. 10/28/17   Derwood Kaplan, MD  methocarbamol (ROBAXIN) 500 MG tablet Take 1 tablet (500 mg total) by mouth 3 (three) times daily as needed for up to 5 days for muscle spasms. 01/27/18 02/01/18  Orvil Feil, PA-C  oseltamivir (TAMIFLU) 75 MG capsule Take 1 capsule (75 mg total) by mouth every 12 (twelve) hours. 10/28/17   Derwood Kaplan, MD  predniSONE (DELTASONE) 50 MG tablet Take one 50 mg tablet once daily for the next five days. 01/27/18   Orvil Feil, PA-C    Allergies Penicillins  Family History  Problem Relation Age of Onset  . Hypertension Mother   . Hypertension Father     Social History Social History   Tobacco Use  . Smoking status: Current Every Day Smoker    Packs/day: 0.50    Types: Cigarettes  . Smokeless tobacco: Never Used  Substance Use Topics  . Alcohol use: No  . Drug use: No     Review of Systems  Constitutional: No fever/chills Eyes: No visual changes. No discharge ENT: No upper respiratory complaints. Cardiovascular: no chest pain. Respiratory: no cough. No SOB. Gastrointestinal: No abdominal pain.  No nausea, no vomiting.  No diarrhea.  No constipation. Musculoskeletal: Patient has bilateral low back pain. Skin: Negative for rash, abrasions, lacerations, ecchymosis. Neurological: Negative for headaches, focal weakness or numbness.  ____________________________________________   PHYSICAL EXAM:  VITAL SIGNS: ED Triage Vitals  Enc Vitals Group  BP 01/27/18 2020 129/82     Pulse Rate 01/27/18 2020 82     Resp 01/27/18 2020 20     Temp 01/27/18 2020 97.7 F (36.5 C)     Temp Source 01/27/18 2020 Oral     SpO2 01/27/18 2020 99 %     Weight 01/27/18 2018 180 lb (81.6 kg)     Height 01/27/18 2018 5\' 2"  (1.575 m)     Head Circumference --      Peak Flow --      Pain Score 01/27/18 2018 7     Pain Loc --      Pain Edu? --      Excl. in GC? --      Constitutional: Alert and oriented. Well appearing and in no acute  distress. Eyes: Conjunctivae are normal. PERRL. EOMI. Head: Atraumatic. Cardiovascular: Normal rate, regular rhythm. Normal S1 and S2.  Good peripheral circulation. Respiratory: Normal respiratory effort without tachypnea or retractions. Lungs CTAB. Good air entry to the bases with no decreased or absent breath sounds. Musculoskeletal: Full range of motion to all extremities. No gross deformities appreciated.  Positive straight leg raise, left.  Patient has paraspinal muscle tenderness along the bilateral lumbar spine. Neurologic:  Normal speech and language. No gross focal neurologic deficits are appreciated.  Skin:  Skin is warm, dry and intact. No rash noted. Psychiatric: Mood and affect are normal. Speech and behavior are normal. Patient exhibits appropriate insight and judgement.   ____________________________________________   LABS (all labs ordered are listed, but only abnormal results are displayed)  Labs Reviewed  URINALYSIS, COMPLETE (UACMP) WITH MICROSCOPIC - Abnormal; Notable for the following components:      Result Value   Color, Urine YELLOW (*)    APPearance HAZY (*)    Ketones, ur 5 (*)    All other components within normal limits  POC URINE PREG, ED  POCT PREGNANCY, URINE   ____________________________________________  EKG   ____________________________________________  RADIOLOGY   No results found.  ____________________________________________    PROCEDURES  Procedure(s) performed:    Procedures    Medications  ketorolac (TORADOL) 30 MG/ML injection 30 mg (30 mg Intramuscular Given 01/27/18 2143)  predniSONE (DELTASONE) tablet 60 mg (60 mg Oral Given 01/27/18 2143)     ____________________________________________   INITIAL IMPRESSION / ASSESSMENT AND PLAN / ED COURSE  Pertinent labs & imaging results that were available during my care of the patient were reviewed by me and considered in my medical decision making (see chart for  details).  Review of the [place] CSRS was performed in accordance of the [facility] prior to dispensing any controlled drugs.    Assessment and plan Low back pain Patient presents to the emergency department with low back pain that has occurred for the past several months.  Patient has radicular pain of the left lower extremity and has tried ibuprofen for pain.  Patient was given an injection of Toradol in the emergency department as well as prednisone.  She was discharged with prednisone and Robaxin.  Vital signs are reassuring prior to discharge.  All patient questions were answered.      ____________________________________________  FINAL CLINICAL IMPRESSION(S) / ED DIAGNOSES  Final diagnoses:  Chronic bilateral low back pain with left-sided sciatica      NEW MEDICATIONS STARTED DURING THIS VISIT:  ED Discharge Orders        Ordered    predniSONE (DELTASONE) 50 MG tablet     01/27/18 2148  methocarbamol (ROBAXIN) 500 MG tablet  3 times daily PRN     01/27/18 2148          This chart was dictated using voice recognition software/Dragon. Despite best efforts to proofread, errors can occur which can change the meaning. Any change was purely unintentional.    Orvil Feil, PA-C 01/27/18 2322    Pia Mau [place], PA-C 01/27/18 2322    Myrna Blazer, MD 01/31/18 [id number]

## 2018-01-27 NOTE — ED Notes (Signed)
Patient reports pain to lower back for months which shoots down her left leg. Denies any known injury.

## 2018-01-27 NOTE — ED Triage Notes (Signed)
Patient ambulatory to triage with steady gait, without difficulty or distress noted; pt reports lower back pain for "months" radiating down left leg; denies any injury, denies any accomp symptoms; st hx of same but never examined

## 2018-05-12 ENCOUNTER — Encounter (HOSPITAL_COMMUNITY): Payer: Self-pay

## 2018-05-12 ENCOUNTER — Other Ambulatory Visit: Payer: Self-pay

## 2018-05-12 ENCOUNTER — Emergency Department (HOSPITAL_COMMUNITY)
Admission: EM | Admit: 2018-05-12 | Discharge: 2018-05-12 | Disposition: A | Discharge status: Home / Self Care | Payer: Self-pay | Attending: Emergency Medicine | Admitting: Emergency Medicine

## 2018-05-12 DIAGNOSIS — R42 Dizziness and giddiness: Secondary | ICD-10-CM | POA: Insufficient documentation

## 2018-05-12 DIAGNOSIS — Z5321 Procedure and treatment not carried out due to patient leaving prior to being seen by health care provider: Secondary | ICD-10-CM | POA: Insufficient documentation

## 2018-05-12 LAB — CBG MONITORING, ED: Glucose-Capillary: 88 mg/dL (ref 70–99)

## 2018-05-12 NOTE — ED Triage Notes (Signed)
Pt has been having dizzy spells for the lat few weeks as well as the tips of her fingers and toes have been going numb. Pt does not have a history of diabetes. States she can eat, but is always hungry. NAD. Also states she is urinating more frequently

## 2018-07-09 ENCOUNTER — Emergency Department (HOSPITAL_COMMUNITY)
Admission: EM | Admit: 2018-07-09 | Discharge: 2018-07-09 | Disposition: A | Discharge status: Home / Self Care | Payer: Self-pay | Attending: Emergency Medicine | Admitting: Emergency Medicine

## 2018-07-09 ENCOUNTER — Other Ambulatory Visit: Payer: Self-pay

## 2018-07-09 ENCOUNTER — Encounter (HOSPITAL_COMMUNITY): Payer: Self-pay | Admitting: Emergency Medicine

## 2018-07-09 DIAGNOSIS — F17210 Nicotine dependence, cigarettes, uncomplicated: Secondary | ICD-10-CM | POA: Insufficient documentation

## 2018-07-09 DIAGNOSIS — B349 Viral infection, unspecified: Secondary | ICD-10-CM | POA: Insufficient documentation

## 2018-07-09 MED ORDER — NAPROXEN 500 MG PO TABS: 500.0000 mg | ORAL_TABLET | Freq: Two times a day (BID) | ORAL | 0 refills | Status: DC

## 2018-07-09 NOTE — ED Provider Notes (Signed)
[facility] Emergency Department Provider Note MRN:  [id number]  Arrival date & time: 07/09/18     Chief Complaint   Weakness   History of Present Illness   Jillian Oneill is a 37 y.o. year-old female with a history of kidney stones presenting to the ED with chief complaint of weakness.  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  Yesterday began feeling general weakness, malaise, fever up to 103.  Mild cough, mild nasal congestion.  Denies headache, no neck pain, no chest pain, no shortness of breath, no abdominal pain, no dysuria, no rash.  Endorsing lightheadedness when standing or lifting things at work.  Review of Systems  A complete 10 system review of systems was obtained and all systems are negative except as noted in the HPI and PMH.   Patient's Health History    Past Medical History:  Diagnosis Date  . Kidney stones     Past Surgical History:  Procedure Laterality Date  . CESAREAN SECTION    . CESAREAN SECTION    . TUBAL LIGATION      Family History  Problem Relation Age of Onset  . Hypertension Mother   . Hypertension Father     Social History   Socioeconomic History  . Marital status: Married    Spouse name: Not on file  . Number of children: Not on file  . Years of education: Not on file  . Highest education level: Not on file  Occupational History  . Not on file  Social Needs  . Financial resource strain: Not on file  . Food insecurity:    Worry: Not on file    Inability: Not on file  . Transportation needs:    Medical: Not on file    Non-medical: Not on file  Tobacco Use  . Smoking status: Current Every Day Smoker    Packs/day: 0.50    Types: Cigarettes  . Smokeless tobacco: Never Used  Substance and Sexual Activity  . Alcohol use: No  . Drug use: No  . Sexual activity: Yes    Birth control/protection: Surgical  Lifestyle  .  Physical activity:    Days per week: Not on file    Minutes per session: Not on file  . Stress: Not on file  Relationships  . Social connections:    Talks on phone: Not on file    Gets together: Not on file    Attends religious service: Not on file    Active member of club or organization: Not on file    Attends meetings of clubs or organizations: Not on file    Relationship status: Not on file  . Intimate partner violence:    Fear of current or ex partner: Not on file    Emotionally abused: Not on file    Physically abused: Not on file    Forced sexual activity: Not on file  Other Topics Concern  . Not on file  Social History Narrative  . Not on file     Physical Exam  Vital Signs and Nursing Notes reviewed Vitals:   07/09/18 2129  BP: (!) 105/45  Pulse: 85  Resp: 18  Temp: 97.7 F (36.5 C)  SpO2: 99%    CONSTITUTIONAL: Well-appearing, NAD NEURO:  Alert and oriented x 3, no focal deficits, no meningismus EYES:  eyes equal and reactive ENT/NECK:  no LAD, no JVD CARDIO:  Regular rate, well-perfused, normal S1 and S2 PULM:  CTAB no wheezing or rhonchi GI/GU:  normal bowel sounds, non-distended, non-tender MSK/SPINE:  No gross deformities, no edema SKIN:  no rash, atraumatic PSYCH:  Appropriate speech and behavior  Diagnostic and Interventional Summary    EKG Interpretation  Date/Time:    Ventricular Rate:    PR Interval:    QRS Duration:   QT Interval:    QTC Calculation:   R Axis:     Text Interpretation:        Labs Reviewed - No data to display  No orders to display    Medications - No data to display   Procedures Critical Care  ED Course and Medical Decision Making  I have reviewed the triage vital signs and the nursing notes.  Pertinent labs & imaging results that were available during my care of the patient were reviewed by me and considered in my medical decision making (see below for details).  Favoring viral illness in this 37 year old  female with less than 24 hours of flulike symptoms.  Patient defers pregnancy testing today, stating that there is no chance she is pregnant.  Discussed pros and cons of Tamiflu, deferred influenza testing and treatment at this time.  Will focus on hydration at home, anti-inflammatories, work note for tomorrow.  After the discussed management above, the patient was determined to be safe for discharge.  The patient was in agreement with this plan and all questions regarding their care were answered.  ED return precautions were discussed and the patient will return to the ED with any significant worsening of condition.   Elmer Sow. Pilar Plate, MD [facility] Emergency Medicine [facility] [email or web address]  Final Clinical Impressions(s) / ED Diagnoses     ICD-10-CM   1. Viral illness B34.9     ED Discharge Orders         Ordered    naproxen (NAPROSYN) 500 MG tablet  2 times daily     07/09/18 2207             Sabas Sous, MD 07/09/18 2209

## 2018-07-09 NOTE — Discharge Instructions (Addendum)
You were evaluated in the Emergency Department and after careful evaluation, we did not find any emergent condition requiring admission or further testing in the hospital.  Your symptoms today seem to be due to a viral illness, possibly the flu.  Please continue plenty of fluids at home, use the anti-inflammatory provided as directed.  Please return to the Emergency Department if you experience any worsening of your condition.  We encourage you to follow up with a primary care provider.  Thank you for allowing us to be a part of your care.

## 2018-07-09 NOTE — ED Triage Notes (Signed)
Pt states she has been "feeling weak" since yesterday. Pt denies any N/V/D or cough. Pt states she does not have an appetite. Pt is able to drink okay. Pt states she had a temp of 103 yesterday.

## 2018-07-27 IMAGING — DX DG ELBOW COMPLETE 3+V*L*
4 series · 4 of 4 positions shown · non-contrast
Comparison: None.

CLINICAL DATA: Posterior left elbow pain for the past 3 months. No
known injury.

EXAM:
LEFT ELBOW - COMPLETE 3+ VIEW

[elbow ap]
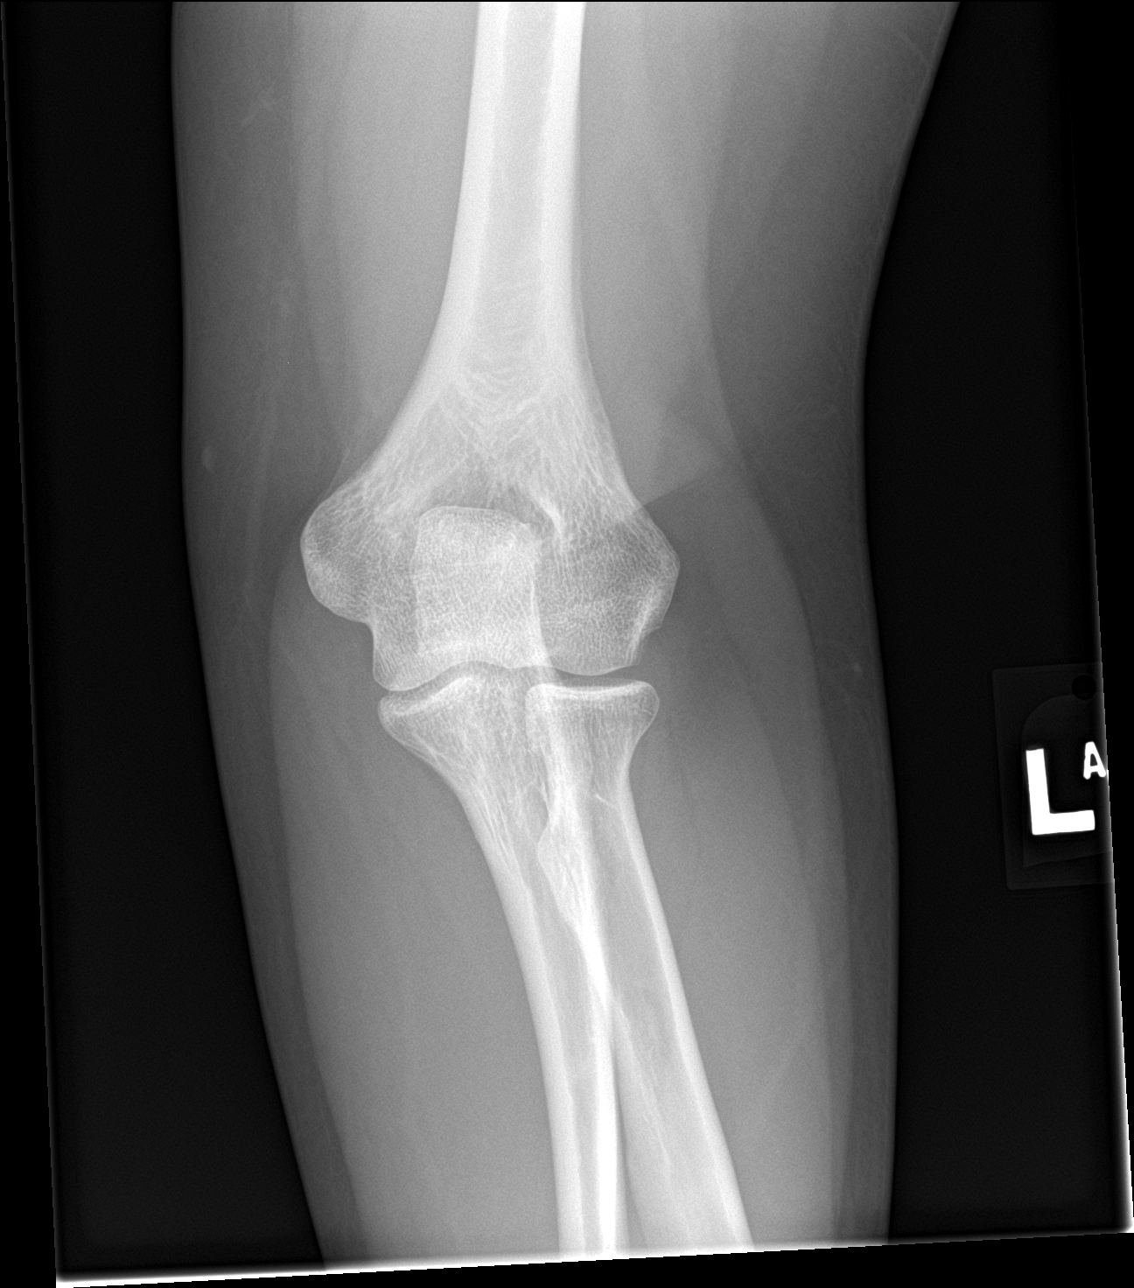

[elbow obl (1 of 2)]
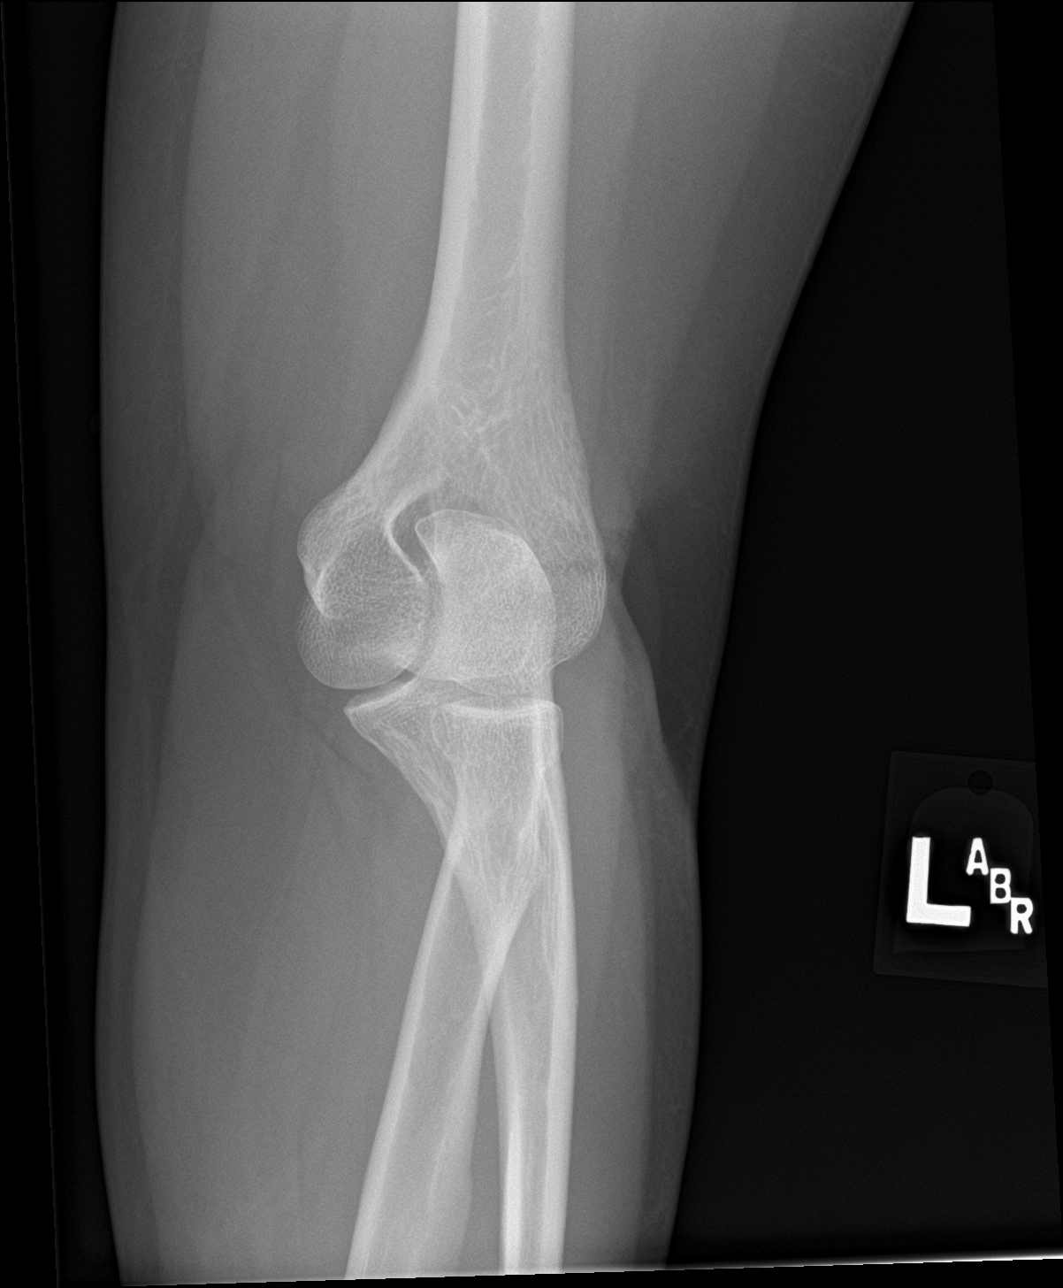

[elbow lat]
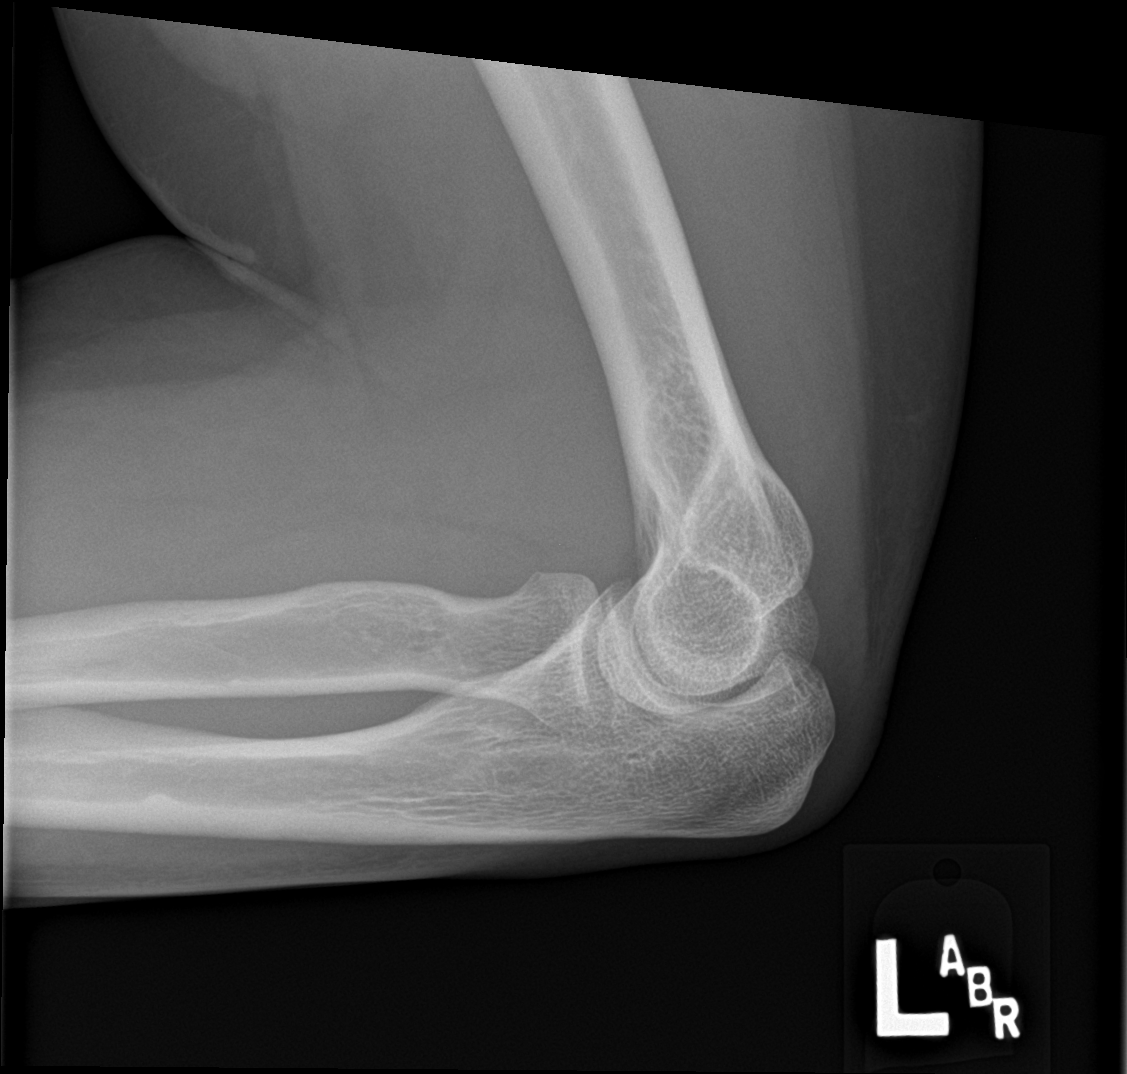

[elbow obl (2 of 2)]
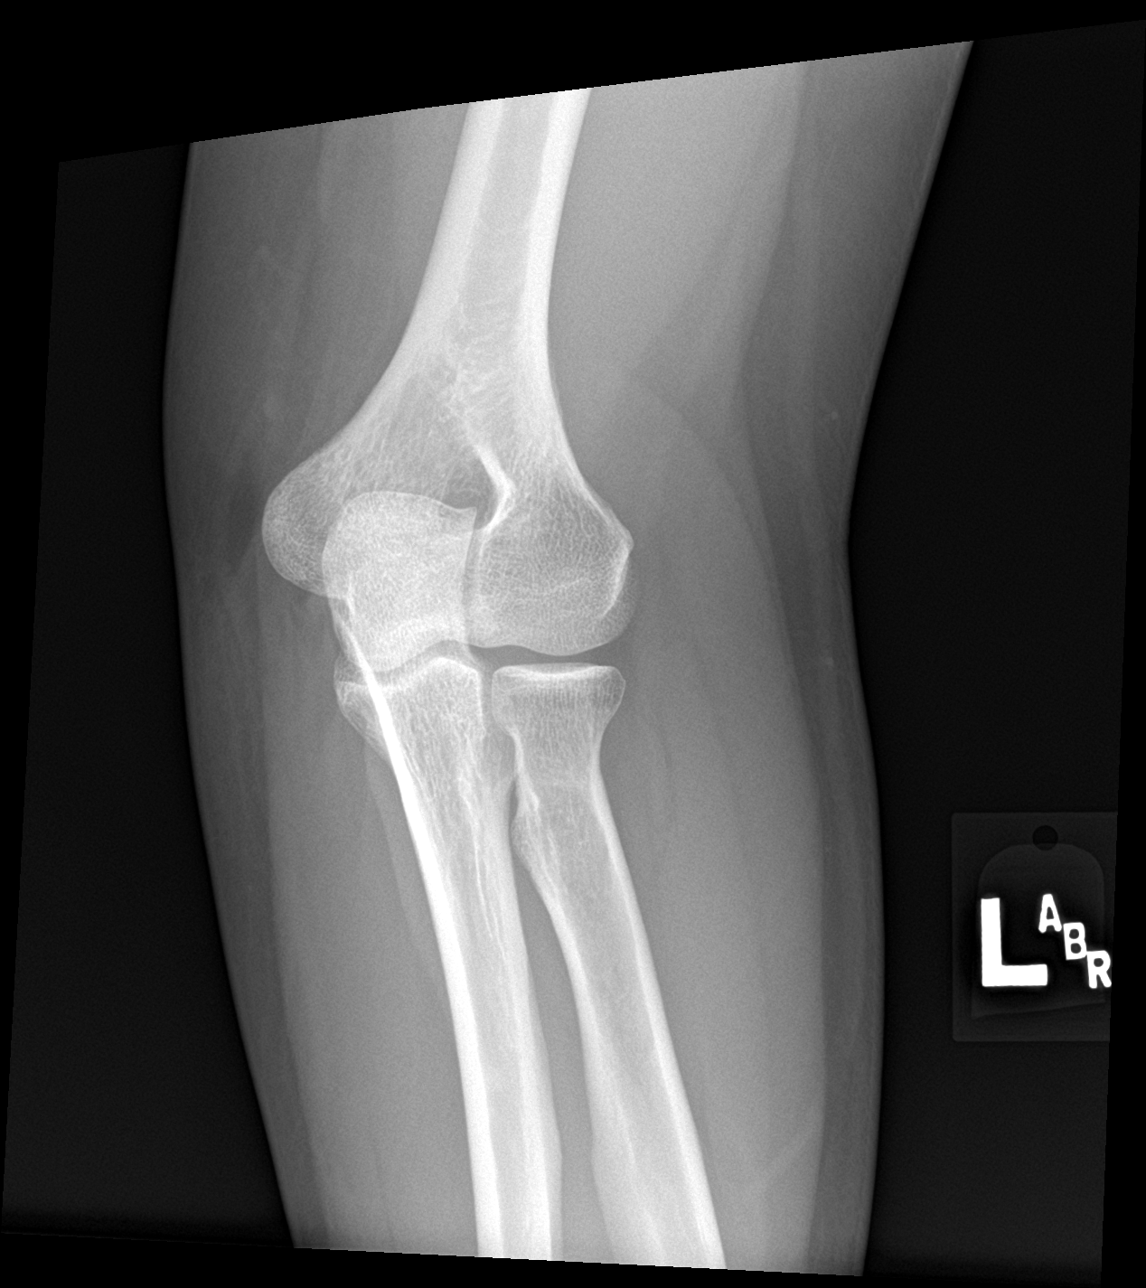

[4 of 4 positions shown; findings below may reference images not displayed]

FINDINGS: No fracture or elbow joint effusion. Joint spaces are preserved. No
erosions. Regional soft tissues appear normal. No radiopaque foreign
body.
IMPRESSION: Normal radiographs of the left elbow.

## 2019-06-03 ENCOUNTER — Emergency Department (HOSPITAL_COMMUNITY)
Admission: EM | Admit: 2019-06-03 | Discharge: 2019-06-03 | Disposition: A | Discharge status: Home / Self Care | Payer: Self-pay | Attending: Emergency Medicine | Admitting: Emergency Medicine

## 2019-06-03 ENCOUNTER — Other Ambulatory Visit: Payer: Self-pay

## 2019-06-03 ENCOUNTER — Encounter (HOSPITAL_COMMUNITY): Payer: Self-pay | Admitting: Emergency Medicine

## 2019-06-03 DIAGNOSIS — G8929 Other chronic pain: Secondary | ICD-10-CM | POA: Insufficient documentation

## 2019-06-03 DIAGNOSIS — F17210 Nicotine dependence, cigarettes, uncomplicated: Secondary | ICD-10-CM | POA: Insufficient documentation

## 2019-06-03 DIAGNOSIS — Z79899 Other long term (current) drug therapy: Secondary | ICD-10-CM | POA: Insufficient documentation

## 2019-06-03 DIAGNOSIS — M549 Dorsalgia, unspecified: Secondary | ICD-10-CM | POA: Insufficient documentation

## 2019-06-03 MED ORDER — MELOXICAM 7.5 MG PO TABS: 7.5000 mg | ORAL_TABLET | Freq: Every day | ORAL | 0 refills | Status: AC

## 2019-06-03 MED ORDER — CYCLOBENZAPRINE HCL 10 MG PO TABS: 10.0000 mg | ORAL_TABLET | Freq: Two times a day (BID) | ORAL | 0 refills | Status: AC | PRN

## 2019-06-03 NOTE — ED Triage Notes (Signed)
Pt states that her whole back is hurting she states that this has been going on for about 18 years.

## 2019-06-03 NOTE — ED Provider Notes (Signed)
[facility] EMERGENCY DEPARTMENT Provider Note   CSN: [id number] Arrival date & time: 06/03/19  1101     History   Chief Complaint Chief Complaint  Patient presents with  . Back Pain    HPI Jillian Oneill is a 38 y.o. female.     38yo female with neck and back pain x several years without any prior injuries or incidents. No relief with OTC medications. Works on an [employer] doing a lot of lifting and twisting daily. No changes from chronic pain. Has never been seen for this previously.      Past Medical History:  Diagnosis Date  . Kidney stones     There are no active problems to display for this patient.   Past Surgical History:  Procedure Laterality Date  . CESAREAN SECTION    . CESAREAN SECTION    . TUBAL LIGATION       OB History    Gravida      Para      Term      Preterm      AB      Living  2     SAB      TAB      Ectopic      Multiple      Live Births               Home Medications    Prior to Admission medications   Medication Sig Start Date End Date Taking? Authorizing Provider  acetaminophen (TYLENOL 8 HOUR) 650 MG CR tablet Take 1 tablet (650 mg total) by mouth every 8 (eight) hours. 10/28/17   Varney Biles, MD  albuterol (PROVENTIL HFA;VENTOLIN HFA) 108 (90 Base) MCG/ACT inhaler Inhale 1-2 puffs into the lungs every 6 (six) hours as needed for wheezing or shortness of breath. 09/28/17   Nat Christen, MD  cyclobenzaprine (FLEXERIL) 10 MG tablet Take 1 tablet (10 mg total) by mouth 2 (two) times daily as needed for muscle spasms. 06/03/19   Tacy Learn, PA-C  diphenhydrAMINE (BENADRYL) 25 mg capsule Take 1 capsule (25 mg total) by mouth every 6 (six) hours as needed for itching. 10/28/17   Varney Biles, MD  meloxicam (MOBIC) 7.5 MG tablet Take 1 tablet (7.5 mg total) by mouth daily. 06/03/19   Tacy Learn, PA-C    Family History Family History  Problem Relation Age of Onset  . Hypertension Mother   .  Hypertension Father     Social History Social History   Tobacco Use  . Smoking status: Current Every Day Smoker    Packs/day: 0.50    Types: Cigarettes  . Smokeless tobacco: Never Used  Substance Use Topics  . Alcohol use: No  . Drug use: No     Allergies   Penicillins   Review of Systems Review of Systems  Constitutional: Negative for fever.  Gastrointestinal: Negative for abdominal pain.  Musculoskeletal: Positive for back pain, myalgias and neck pain. Negative for arthralgias, gait problem and joint swelling.  Skin: Negative for rash and wound.  Allergic/Immunologic: Negative for immunocompromised state.  Neurological: Negative for weakness and numbness.  Hematological: Negative for adenopathy.  All other systems reviewed and are negative.    Physical Exam Updated Vital Signs BP 110/77 (BP Location: Right Arm)   Pulse 68   Temp 97.9 F (36.6 C) (Oral)   Resp 14   Ht 5\' 2"  (1.575 m)   Wt 81.6 kg   SpO2 98%  BMI 32.92 kg/m   Physical Exam Vitals signs and nursing note reviewed.  Constitutional:      General: She is not in acute distress.    Appearance: She is well-developed. She is not diaphoretic.  HENT:     Head: Normocephalic and atraumatic.  Cardiovascular:     Pulses: Normal pulses.  Pulmonary:     Effort: Pulmonary effort is normal.  Musculoskeletal:        General: Tenderness present. No swelling, deformity or signs of injury.     Cervical back: She exhibits tenderness. She exhibits normal range of motion.     Thoracic back: She exhibits tenderness. She exhibits normal range of motion.     Lumbar back: She exhibits tenderness. She exhibits normal range of motion.       Arms:     Right lower leg: No edema.     Left lower leg: No edema.     Comments: TTP entire posterior neck and back, no skin changes, no crepitus, no step off.  Skin:    General: Skin is warm and dry.     Findings: No erythema or rash.  Neurological:     Mental Status:  She is alert and oriented to person, place, and time.     Sensory: No sensory deficit.     Motor: No weakness.     Coordination: Coordination normal.     Gait: Gait normal.     Deep Tendon Reflexes: Reflexes normal.     Reflex Scores:      Bicep reflexes are 2+ on the right side and 2+ on the left side.      Brachioradialis reflexes are 2+ on the right side and 2+ on the left side.      Patellar reflexes are 2+ on the right side and 2+ on the left side. Psychiatric:        Behavior: Behavior normal.      ED Treatments / Results  Labs (all labs ordered are listed, but only abnormal results are displayed) Labs Reviewed - No data to display  EKG None  Radiology No results found.  Procedures Procedures (including critical care time)  Medications Ordered in ED Medications - No data to display   Initial Impression / Assessment and Plan / ED Course  I have reviewed the triage vital signs and the nursing notes.  Pertinent labs & imaging results that were available during my care of the patient were reviewed by me and considered in my medical decision making (see chart for details).  Clinical Course as of Jun 02 1246  Thu Jun 03, 2019  1245 38yo female with complaint of diffuse neck and back pain without recent or remote history of injury. On exam, TTP diffuse neck and back, no skin changes, reflexes, arm/leg strength equal. Given rx for Flexeril and Meloxicam. Recommend warm compresses, gentle stretching, see PCP for referral to PT. Discussed reasonable expectations for treatment given years of pain, not likely to improve with rx alone.    [LM]    Clinical Course User Index [LM] Jeannie FendMurphy,  A, PA-C      Final Clinical Impressions(s) / ED Diagnoses   Final diagnoses:  Chronic back pain, unspecified back location, unspecified back pain laterality    ED Discharge Orders         Ordered    cyclobenzaprine (FLEXERIL) 10 MG tablet  2 times daily PRN     06/03/19 1241     meloxicam (MOBIC) 7.5 MG tablet  Daily     06/03/19 1241           Jeannie Fend, PA-C 06/03/19 1247    Terald Sleeper, MD 06/03/19 1726

## 2019-06-03 NOTE — Discharge Instructions (Signed)
Take Flexeril as needed as prescribed. Do not take if driving or operating machinery. Take Meloxicam daily. Do not take any other NSAID medications (Aleve, motrin, advil, ibuproven, naproxen) while taking Meloxicam. Warm compresses for 30 minutes followed by stretching.  Follow up with your doctor for referral to physical therapy.

## 2019-06-04 ENCOUNTER — Ambulatory Visit: Payer: Self-pay

## 2019-06-04 NOTE — Telephone Encounter (Signed)
Incoming call from Pt.  Reporting that she was at the Ed last night for muscular pain of shoulder.Patient was Prscribed Flexeril  Patient states that she is still sleepy and groggy.  Patient states she will need a different kind of medication.  Patient would like provider to call her at [phone] .           Reason for Disposition . [1] Caller has NON-URGENT medication question about med that PCP prescribed AND [2] triager unable to answer question  Answer Assessment - Initial Assessment Questions 1.   NAME of MEDICATION: "What medicine are you calling about?"     cylovenzater 2.   QUESTION: "What is your question?"     How long will shoulder is relaxing    3.   PRESCRIBING HCP: "Who prescribed it?" Reason: if prescribed by specialist, call should be referred to that group.     Jodi Mourning.  4. SYMPTOMS: "Do you have any symptoms?"     Making patient.  5. SEVERITY: If symptoms are present, ask "Are they mild, moderate or severe?"    severe 6.  PREGNANCY:  "Is there any chance that you are pregnant?" "When was your last menstrual period?"  Protocols used: MEDICATION QUESTION CALL-A-AH

## 2019-06-07 ENCOUNTER — Telehealth: Payer: Self-pay | Admitting: *Deleted

## 2019-06-07 NOTE — Telephone Encounter (Signed)
Attempted to call pt at home number but no answer. Unable to leave a message because voicemail was not set up.   See triage encounter on 06/04/19.  Returned call to pt at [phone] but unable to leave a voicemail message due to full mailbox.

## 2019-06-07 NOTE — Telephone Encounter (Signed)
-----   Message from Kendrick Ranch, RN sent at 06/07/2019  1:09 PM EST ----- Regarding: FW: patient requesting med change after ED visit [facility], can you please follow up on this patient and make sure the correct provider is notified.  Thank you,  TP ----- Message ----- From: Wynona Luna, MD Sent: 06/04/2019  11:12 PM EST To: Kendrick Ranch, RN, Wynona Luna, MD Subject: patient requesting med change after ED visit   Patient was seen at [facility] ED 11/5 by Suella Broad PA and prescribed a med (flexeril) that made her sleepy.  She called [facility] requesting a med change/call back from provider, and the encounter was routed to me.   Can you help? I am not sure who the note should be routed to--the patient does not seem to have a PCP in the system.  I am not comfortable intervening.  Let me know if we should talk more about this-- Thanks!

## 2019-07-10 ENCOUNTER — Other Ambulatory Visit: Payer: Self-pay

## 2019-07-10 ENCOUNTER — Encounter (HOSPITAL_COMMUNITY): Payer: Self-pay | Admitting: Emergency Medicine

## 2019-07-10 ENCOUNTER — Emergency Department (HOSPITAL_COMMUNITY)
Admission: EM | Admit: 2019-07-10 | Discharge: 2019-07-11 | Disposition: A | Discharge status: Home / Self Care | Payer: Self-pay | Attending: Emergency Medicine | Admitting: Emergency Medicine

## 2019-07-10 DIAGNOSIS — B349 Viral infection, unspecified: Secondary | ICD-10-CM | POA: Insufficient documentation

## 2019-07-10 DIAGNOSIS — F17210 Nicotine dependence, cigarettes, uncomplicated: Secondary | ICD-10-CM | POA: Insufficient documentation

## 2019-07-10 DIAGNOSIS — Z20828 Contact with and (suspected) exposure to other viral communicable diseases: Secondary | ICD-10-CM | POA: Insufficient documentation

## 2019-07-10 LAB — COMPREHENSIVE METABOLIC PANEL
ALT: 19 U/L (ref 0–44)
AST: 18 U/L (ref 15–41)
Albumin: 4.3 g/dL (ref 3.5–5.0)
Alkaline Phosphatase: 66 U/L (ref 38–126)
Anion gap: 9 (ref 5–15)
BUN: 16 mg/dL (ref 6–20)
CO2: 26 mmol/L (ref 22–32)
Calcium: 9.2 mg/dL (ref 8.9–10.3)
Chloride: 106 mmol/L (ref 98–111)
Creatinine, Ser: 0.77 mg/dL (ref 0.44–1.00)
GFR calc Af Amer: >60 mL/min (ref >60)
GFR calc non Af Amer: >60 mL/min (ref >60)
Glucose, Bld: 99 mg/dL (ref 70–99)
Potassium: 3.9 mmol/L (ref 3.5–5.1)
Sodium: 141 mmol/L (ref 135–145)
Total Bilirubin: 0.1 mg/dL — ABNORMAL LOW (ref 0.3–1.2)
Total Protein: 7.3 g/dL (ref 6.5–8.1)

## 2019-07-10 LAB — CBC
HCT: 44.4 % (ref 36.0–46.0)
Hemoglobin: 14.2 g/dL (ref 12.0–15.0)
MCH: 30.1 pg (ref 26.0–34.0)
MCHC: 32 g/dL (ref 30.0–36.0)
MCV: 94.3 fL (ref 80.0–100.0)
Platelets: 210 10*3/uL (ref 150–400)
RBC: 4.71 MIL/uL (ref 3.87–5.11)
RDW: 11.7 % (ref 11.5–15.5)
WBC: 6.8 10*3/uL (ref 4.0–10.5)
nRBC: 0 % (ref 0.0–0.2)

## 2019-07-10 NOTE — ED Provider Notes (Signed)
[facility] Emergency Department Provider Note MRN:  [id number]  Arrival date & time: 07/10/19     Chief Complaint   Fever   History of Present Illness   Jillian Oneill is a 38 y.o. year-old female with a history of kidney stones presenting to the ED with chief complaint of fever.  For the past 1 to 2 days, patient has been experiencing a number of symptoms.  Began with watery diarrhea, progressed to full body aches and generalized weakness, dull frontal headache, gradual onset.  Also with mild sore throat, mild nasal congestion, low-grade subjective fevers at home.  Symptoms mild in severity, constant.  Patient is a Research scientist (physical sciences)healthcare worker.  No known exposures to coronavirus, no known sick contacts.  Review of Systems  A complete 10 system review of systems was obtained and all systems are negative except as noted in the HPI and PMH.   Patient's Health History    Past Medical History:  Diagnosis Date  . Kidney stones     Past Surgical History:  Procedure Laterality Date  . CESAREAN SECTION    . CESAREAN SECTION    . TUBAL LIGATION      Family History  Problem Relation Age of Onset  . Hypertension Mother   . Hypertension Father     Social History   Socioeconomic History  . Marital status: Married    Spouse name: Not on file  . Number of children: Not on file  . Years of education: Not on file  . Highest education level: Not on file  Occupational History  . Not on file  Tobacco Use  . Smoking status: Current Every Day Smoker    Packs/day: 0.50    Types: Cigarettes  . Smokeless tobacco: Never Used  Substance and Sexual Activity  . Alcohol use: No  . Drug use: No  . Sexual activity: Yes    Birth control/protection: Surgical  Other Topics Concern  . Not on file  Social History Narrative  . Not on file   Social Determinants of Health   Financial Resource Strain:   . Difficulty of Paying Living Expenses: Not on file  Food Insecurity:     . Worried About Programme researcher, broadcasting/film/videounning Out of Food in the Last Year: Not on file  . Ran Out of Food in the Last Year: Not on file  Transportation Needs:   . Lack of Transportation (Medical): Not on file  . Lack of Transportation (Non-Medical): Not on file  Physical Activity:   . Days of Exercise per Week: Not on file  . Minutes of Exercise per Session: Not on file  Stress:   . Feeling of Stress : Not on file  Social Connections:   . Frequency of Communication with Friends and Family: Not on file  . Frequency of Social Gatherings with Friends and Family: Not on file  . Attends Religious Services: Not on file  . Active Member of Clubs or Organizations: Not on file  . Attends BankerClub or Organization Meetings: Not on file  . Marital Status: Not on file  Intimate Partner Violence:   . Fear of Current or Ex-Partner: Not on file  . Emotionally Abused: Not on file  . Physically Abused: Not on file  . Sexually Abused: Not on file     Physical Exam  Vital Signs and Nursing Notes reviewed Vitals:   07/10/19 1724 07/10/19 2305  BP: 121/80 125/87  Pulse: 72 60  Resp: 20 16  Temp: 98.2 F (36.8  C) (!) 97.5 F (36.4 C)  SpO2: 100% 99%    CONSTITUTIONAL: Well-appearing, NAD NEURO:  Alert and oriented x 3, no focal deficits EYES:  eyes equal and reactive ENT/NECK:  no LAD, no JVD CARDIO: Regular rate, well-perfused, normal S1 and S2 PULM:  CTAB no wheezing or rhonchi GI/GU:  normal bowel sounds, non-distended, non-tender MSK/SPINE:  No gross deformities, no edema SKIN:  no rash, atraumatic PSYCH:  Appropriate speech and behavior  Diagnostic and Interventional Summary    EKG Interpretation  Date/Time:    Ventricular Rate:    PR Interval:    QRS Duration:   QT Interval:    QTC Calculation:   R Axis:     Text Interpretation:        Labs Reviewed  COMPREHENSIVE METABOLIC PANEL - Abnormal; Notable for the following components:      Result Value   Total Bilirubin 0.1 (*)    All other  components within normal limits  SARS CORONAVIRUS 2 (TAT 6-24 HRS)  CBC  URINALYSIS, ROUTINE W REFLEX MICROSCOPIC    No orders to display    Medications - No data to display   Procedures  /  Critical Care Procedures  ED Course and Medical Decision Making  I have reviewed the triage vital signs and the nursing notes.  Pertinent labs & imaging results that were available during my care of the patient were reviewed by me and considered in my medical decision making (see below for details).     Consistent with a viral illness, reassuring vital signs, no increased work of breathing, no hypoxia, lungs clear, abdomen completely soft and nontender, no recent antibiotics or blood in the stool to suggest a bacterial etiology of the diarrhea.  Patient is appropriate for coronavirus testing and home quarantine until she receives a negative test result.  Jillian Oneill was evaluated in Emergency Department on 07/10/2019 for the symptoms described in the history of present illness. She was evaluated in the context of the global COVID-19 pandemic, which necessitated consideration that the patient might be at risk for infection with the SARS-CoV-2 virus that causes COVID-19. Institutional protocols and algorithms that pertain to the evaluation of patients at risk for COVID-19 are in a state of rapid change based on information released by regulatory bodies including the CDC and federal and state organizations. These policies and algorithms were followed during the patient's care in the ED.     Barth Kirks. Sedonia Small, [place] [email or web address]  Final Clinical Impressions(s) / ED Diagnoses     ICD-10-CM   1. Viral illness  B34.9     ED Discharge Orders    None       Discharge Instructions Discussed with and Provided to Patient:     Discharge Instructions     You were evaluated in the Emergency Department and after careful evaluation, we  did not find any emergent condition requiring admission or further testing in the hospital.  Your exam/testing today is overall reassuring.  Your symptoms seem to be due to a viral illness, possibly the coronavirus.  We have tested you for the coronavirus here in the Emergency Department.  Please isolate or quarantine at home until you receive a negative test result.  If positive, we recommend continued home quarantine per [facility] recommendations.   Please return to the Emergency Department if you experience any worsening of your condition.  We encourage you to follow up with a primary care  provider.  Thank you for allowing [place] to be a part of your care.      Sabas Sous, MD 07/10/19 [phone]

## 2019-07-10 NOTE — ED Notes (Signed)
ED Provider at bedside. 

## 2019-07-10 NOTE — ED Triage Notes (Addendum)
Patient c/o low grade fever, sore throat, body aches, diarrhea, and generalized weakness that started yesterday. Patient unsure of exact temp. Patient taking ibuprofen and Dayquil-last took Dayquil at 2pm.

## 2019-07-10 NOTE — Discharge Instructions (Signed)
You were evaluated in the Emergency Department and after careful evaluation, we did not find any emergent condition requiring admission or further testing in the hospital.  Your exam/testing today is overall reassuring.  Your symptoms seem to be due to a viral illness, possibly the coronavirus.  We have tested you for the coronavirus here in the Emergency Department.  Please isolate or quarantine at home until you receive a negative test result.  If positive, we recommend continued home quarantine per CDC recommendations.   Please return to the Emergency Department if you experience any worsening of your condition.  We encourage you to follow up with a primary care provider.  Thank you for allowing us to be a part of your care. 

## 2019-07-11 LAB — SARS CORONAVIRUS 2 (TAT 6-24 HRS): SARS Coronavirus 2: NEGATIVE

## 2020-05-16 ENCOUNTER — Encounter: Payer: Self-pay | Admitting: Pulmonary Disease

## 2020-05-16 ENCOUNTER — Other Ambulatory Visit: Payer: Self-pay

## 2020-05-16 ENCOUNTER — Ambulatory Visit (INDEPENDENT_AMBULATORY_CARE_PROVIDER_SITE_OTHER): Payer: Self-pay | Admitting: Pulmonary Disease

## 2020-05-16 VITALS — BP 108/72 | HR 88 | Temp 97.5°F | Ht 62.0 in | Wt 208.0 lb

## 2020-05-16 DIAGNOSIS — Z9189 Other specified personal risk factors, not elsewhere classified: Secondary | ICD-10-CM

## 2020-05-16 DIAGNOSIS — E669 Obesity, unspecified: Secondary | ICD-10-CM

## 2020-05-16 DIAGNOSIS — R0600 Dyspnea, unspecified: Secondary | ICD-10-CM

## 2020-05-16 NOTE — Progress Notes (Signed)
Subjective:    Patient ID: Jillian Oneill, female    DOB: Apr 16, 1981, 39 y.o.   MRN: [id number]  HPI Patient is a 39 year old recent former smoker (quit 2 months ago) presents for evaluation of dyspnea of 15 to 20 years duration.  Patient is kindly referred by Dr. Smith Robert.  The patient states that she has had dyspnea even preceding her years of smoking and also preceding her bout of COVID-19 in August 2021.  Did not require hospitalization for her bout with COVID-19.  She had a chest x-ray performed at that time that actually did not show significant abnormalities.  She has never been evaluated by pulmonary physician previously and has never had pulmonary function testing.  Does note tachypalpitations particularly when exerting herself.  She notes that when she exerts herself she "gives out" feels that she cannot breathe and gets this sensation she is going to "pass out" as noted this has been going on of longstanding.  Nothing seems to improve it.  She has an albuterol inhaler for use as rescue however this does not help her.  She has not found anything that helps her except inactivity.  She has struggled with issues with weight for years however recently she had received some prednisone due to some shoulder pain.  This was around the time that she had COVID-19.  She did not notice any change in her dyspnea with the prednisone.  She has had issues with acid reflux and states that she was placed on "a pill" for it, which helps.  She thinks it may be omeprazole.  She has not had any chest pain.  No fevers, chills or sweats.  No orthopnea or lower extremity edema.  She does have occasional of feeling she wakes up breathless.  She states that her husband notes that she snores sometimes but not all the times.  No apneic spells described by her husband.  She feels refreshed when she wakes up in the morning.  No extra naps during the day.  Review of Systems A 10 point review of systems was performed  and it is as noted above otherwise negative.  Past Medical History:  Diagnosis Date  . Kidney stones    Past Surgical History:  Procedure Laterality Date  . CESAREAN SECTION    . CESAREAN SECTION    . TUBAL LIGATION     Family History  Problem Relation Age of Onset  . Hypertension Mother   . Hypertension Father    Social History   Tobacco Use  . Smoking status: Former Smoker    Packs/day: 0.50    Types: Cigarettes    Quit date: 02/27/2020    Years since quitting: 0.2  . Smokeless tobacco: Never Used  Substance Use Topics  . Alcohol use: No   Allergies  Allergen Reactions  . Penicillins Rash    Has patient had a PCN reaction causing immediate rash, facial/tongue/throat swelling, SOB or lightheadedness with hypotension: No Has patient had a PCN reaction causing severe rash involving mucus membranes or skin necrosis: Yes Has patient had a PCN reaction that required hospitalization No Has patient had a PCN reaction occurring within the last 10 years: Yes If all of the above answers are "NO", then may proceed with Cephalosporin use.    Current Meds  Medication Sig  . acetaminophen (TYLENOL 8 HOUR) 650 MG CR tablet Take 1 tablet (650 mg total) by mouth every 8 (eight) hours.  Marland Kitchen albuterol (PROVENTIL HFA;VENTOLIN HFA) 108 (90  Base) MCG/ACT inhaler Inhale 1-2 puffs into the lungs every 6 (six) hours as needed for wheezing or shortness of breath.  . citalopram (CELEXA) 20 MG tablet Take 20 mg by mouth daily.  . cyclobenzaprine (FLEXERIL) 10 MG tablet Take 1 tablet (10 mg total) by mouth 2 (two) times daily as needed for muscle spasms.  . diphenhydrAMINE (BENADRYL) 25 mg capsule Take 1 capsule (25 mg total) by mouth every 6 (six) hours as needed for itching.  . meloxicam (MOBIC) 7.5 MG tablet Take 1 tablet (7.5 mg total) by mouth daily.   She believes she is on omeprazole for heartburn.  Immunizations: Patient had COVID-19 in August 2021, will get vaccinated per [facility].      Objective:   Physical Exam BP 108/72 (BP Location: Left Arm, Patient Position: Sitting, Cuff Size: Normal)   Pulse 88   Temp (!) 97.5 F (36.4 C) (Temporal)   Ht 5\' 2"  (1.575 m)   Wt 208 lb (94.3 kg)   SpO2 98%   BMI 38.04 kg/m  GENERAL: Morbidly obese woman in no acute distress, fully ambulatory.   HEAD: Normocephalic, atraumatic.  EYES: Pupils equal, round, reactive to light.  No scleral icterus.  MOUTH: Nose/mouth/throat not examined due to masking requirements for COVID 19. NECK: Supple. No thyromegaly. Trachea midline. No JVD.  No adenopathy. PULMONARY: Good air entry bilaterally.  No adventitious sounds. CARDIOVASCULAR: S1 and S2. Regular rate and rhythm.  No rubs, murmurs or gallops heard. ABDOMEN: Obese, otherwise benign. MUSCULOSKELETAL: No joint deformity, no clubbing, no edema.  NEUROLOGIC: No overt focal deficit, fully ambulatory without gait disturbance, speech is fluent. SKIN: Intact,warm,dry. PSYCH: Mood and behavior normal.  Ambulatory oximetry: Ambulatory symmetry failed to show any desaturations with ambulation     Assessment & Plan:     ICD-10-CM   1. Dyspnea, unspecified type  R06.00 ECHOCARDIOGRAM COMPLETE    Pulmonary Function Test [facility] Only   Present of longstanding Not responsive to beta agonists PFTs, 2D echo Suspect multifactorial Query cardiac etiology, obesity, deconditioning  2. Obesity with body mass index of 30.0-39.9  E66.9    This issue adds complexity to her management  3. At risk for sleep apnea  Z91.89    Given body habitus Does have issues with snoring No documented apneas by partner   Orders Placed This Encounter  Procedures  . Pulmonary Function Test [facility] Only    Standing Status:   Future    Standing Expiration Date:   05/16/2021    Scheduling Instructions:     URGENT    Order Specific Question:   Full PFT: includes the following: basic spirometry, spirometry pre & post bronchodilator, diffusion capacity  (DLCO), lung volumes    Answer:   Full PFT  . ECHOCARDIOGRAM COMPLETE    Next available    Standing Status:   Future    Standing Expiration Date:   05/16/2021    Order Specific Question:   Where should this test be performed    Answer:   [facility]    Order Specific Question:   Please indicate who you request to read the echo results.    Answer:   [facility]    Order Specific Question:   Perflutren DEFINITY (image enhancing agent) should be administered unless hypersensitivity or allergy exist    Answer:   Administer Perflutren    Order Specific Question:   Reason for exam-Echo    Answer:   Dyspnea  786.09 / R06.00  Discussion:  Patient has a history of longstanding issues with dyspnea.  These issues preceded her smoking history and recent COVID-19 issues.  She has had problems with obesity most of her life.  She does not seem to respond to beta agonists.  Issue may be multifactorial.  Does not appear to be primary pulmonary issue.  Recent 2D echo to initiate evaluation further studies as needed.  Though she has a history of snoring there is no history of nonrestorative sleep, daytime sleepiness or reported apneas by her partner.  We will keep this possibility open for now awaiting studies above.  We will see the patient in 4 to 6 weeks time she is to contact us prior to that time should any new difficulties arise.  Gailen Shelter, MD [facility]   *This note was dictated using voice recognition software/Dragon.  Despite best efforts to proofread, errors can occur which can change the meaning.  Any change was purely unintentional.

## 2020-05-16 NOTE — Patient Instructions (Signed)
We are going to get breathing tests and a heart test to evaluate your shortness of breath.  Recommend to get into a weight loss program this will help your breathing as well  See you in follow-up in 4 to 6 weeks time call sooner should any new problems arise

## 2020-05-17 ENCOUNTER — Encounter: Payer: Self-pay | Admitting: Pulmonary Disease

## 2020-05-18 ENCOUNTER — Other Ambulatory Visit: Admission: RE | Admit: 2020-05-18 | Payer: Self-pay | Source: Ambulatory Visit

## 2020-05-19 ENCOUNTER — Ambulatory Visit: Payer: Self-pay | Attending: Pulmonary Disease

## 2020-05-19 ENCOUNTER — Telehealth: Payer: Self-pay | Admitting: Pulmonary Disease

## 2020-05-19 NOTE — Telephone Encounter (Signed)
Patient no showed covid test. PFT will need to be rescheduled.   Lm for patient.

## 2020-05-19 NOTE — Telephone Encounter (Signed)
Noted  

## 2020-05-23 NOTE — Telephone Encounter (Signed)
LVM for Patient to return call to reschedule PFTs and Covid Test

## 2020-05-24 ENCOUNTER — Ambulatory Visit: Admission: RE | Admit: 2020-05-24 | Payer: Self-pay | Source: Ambulatory Visit

## 2020-05-24 ENCOUNTER — Institutional Professional Consult (permissible substitution): Payer: Self-pay | Admitting: Pulmonary Disease

## 2020-05-31 NOTE — Telephone Encounter (Signed)
LVM message for the patient to return call to reschedule PFT and Covid Test

## 2020-06-12 NOTE — Telephone Encounter (Signed)
LVM message again today for the patient to return call to reschedule PFT and Covid Test

## 2020-06-20 ENCOUNTER — Encounter: Payer: Self-pay | Admitting: Pulmonary Disease

## 2020-06-20 NOTE — Telephone Encounter (Signed)
Mailed letter to the patient asking her to contact the office to reschedule her Covid Test, PFT, and Echo. I will close this encounter

## 2020-06-20 NOTE — Telephone Encounter (Signed)
I attempted to contact the patient again today to reschedule PFT and covid Test. Her phone just rang and rang. I also see where she no showed for Echo appt on 05/24/20
